# Patient Record
Sex: Female | Born: 1975 | Race: Black or African American | Hispanic: No | Marital: Married | Smoking: Never smoker
Health system: Southern US, Community
[De-identification: ages and names within clinical notes are randomized; demographics above are authoritative.]

## PROBLEM LIST (undated history)

## (undated) DIAGNOSIS — R7303 Prediabetes: Secondary | ICD-10-CM

## (undated) DIAGNOSIS — I10 Essential (primary) hypertension: Secondary | ICD-10-CM

## (undated) DIAGNOSIS — J309 Allergic rhinitis, unspecified: Secondary | ICD-10-CM

## (undated) DIAGNOSIS — J4520 Mild intermittent asthma, uncomplicated: Secondary | ICD-10-CM

## (undated) DIAGNOSIS — Z87898 Personal history of other specified conditions: Secondary | ICD-10-CM

## (undated) DIAGNOSIS — K219 Gastro-esophageal reflux disease without esophagitis: Secondary | ICD-10-CM

## (undated) DIAGNOSIS — Z8739 Personal history of other diseases of the musculoskeletal system and connective tissue: Secondary | ICD-10-CM

## (undated) DIAGNOSIS — R011 Cardiac murmur, unspecified: Secondary | ICD-10-CM

## (undated) DIAGNOSIS — J45909 Unspecified asthma, uncomplicated: Secondary | ICD-10-CM

## (undated) HISTORY — PX: TUBAL LIGATION: SHX77

## (undated) HISTORY — PX: LAPAROSCOPIC BILATERAL SALPINGECTOMY: SHX5889

## (undated) HISTORY — PX: ANKLE SURGERY: SHX546

## (undated) HISTORY — PX: ARTHRODESIS METATARSALPHALANGEAL JOINT (MTPJ): SHX6566

## (undated) HISTORY — PX: DILATION AND CURETTAGE OF UTERUS: SHX78

---

## 1999-02-02 HISTORY — PX: ORIF ANKLE FRACTURE: SUR919

## 2012-10-31 DIAGNOSIS — I10 Essential (primary) hypertension: Secondary | ICD-10-CM | POA: Insufficient documentation

## 2013-08-05 DIAGNOSIS — Z9889 Other specified postprocedural states: Secondary | ICD-10-CM | POA: Insufficient documentation

## 2014-10-24 DIAGNOSIS — J45909 Unspecified asthma, uncomplicated: Secondary | ICD-10-CM | POA: Insufficient documentation

## 2014-11-12 DIAGNOSIS — IMO0001 Reserved for inherently not codable concepts without codable children: Secondary | ICD-10-CM | POA: Insufficient documentation

## 2014-11-12 DIAGNOSIS — Z01419 Encounter for gynecological examination (general) (routine) without abnormal findings: Secondary | ICD-10-CM | POA: Insufficient documentation

## 2014-11-12 DIAGNOSIS — Z803 Family history of malignant neoplasm of breast: Secondary | ICD-10-CM | POA: Insufficient documentation

## 2016-09-09 DIAGNOSIS — R7303 Prediabetes: Secondary | ICD-10-CM | POA: Insufficient documentation

## 2016-10-18 DIAGNOSIS — M2140 Flat foot [pes planus] (acquired), unspecified foot: Secondary | ICD-10-CM | POA: Insufficient documentation

## 2017-11-14 DIAGNOSIS — Z6841 Body Mass Index (BMI) 40.0 and over, adult: Secondary | ICD-10-CM | POA: Insufficient documentation

## 2018-09-28 DIAGNOSIS — N852 Hypertrophy of uterus: Secondary | ICD-10-CM | POA: Insufficient documentation

## 2018-12-12 ENCOUNTER — Other Ambulatory Visit: Payer: Self-pay

## 2018-12-12 ENCOUNTER — Emergency Department (INDEPENDENT_AMBULATORY_CARE_PROVIDER_SITE_OTHER)
Admission: EM | Admit: 2018-12-12 | Discharge: 2018-12-12 | Disposition: A | Discharge status: Home / Self Care | Payer: Federal, State, Local not specified - PPO | Source: Home / Self Care

## 2018-12-12 ENCOUNTER — Emergency Department (INDEPENDENT_AMBULATORY_CARE_PROVIDER_SITE_OTHER): Payer: Federal, State, Local not specified - PPO

## 2018-12-12 DIAGNOSIS — W19XXXA Unspecified fall, initial encounter: Secondary | ICD-10-CM | POA: Diagnosis not present

## 2018-12-12 DIAGNOSIS — M25571 Pain in right ankle and joints of right foot: Secondary | ICD-10-CM | POA: Diagnosis not present

## 2018-12-12 DIAGNOSIS — M25512 Pain in left shoulder: Secondary | ICD-10-CM | POA: Diagnosis not present

## 2018-12-12 MED ORDER — ACETAMINOPHEN 325 MG PO TABS
975.0000 mg | ORAL_TABLET | Freq: Once | ORAL | Status: AC
  Administered 2018-12-12: 975 mg via ORAL

## 2018-12-12 NOTE — ED Triage Notes (Signed)
Pt c/o LT shoulder pain and RT foot pain after taking a fall at home. Fell with leg underneath her and arm extended backwards when she heard a pop in shoulder. Pain 10/10 No OTC pain meds taken.

## 2018-12-12 NOTE — ED Provider Notes (Signed)
Brittney Marshall    CSN: [id number] Arrival date & time: 12/12/18  [id number]      History   Chief Complaint Chief Complaint  Patient presents with  . Fall  . Shoulder Pain    LT  . Foot Pain    RT    HPI Brittney Marshall is a 43 y.o. female with history of hypertension presenting for left shoulder and right ankle pain s/p fall at home yesterday.  Patient denies head trauma, LOC.  Patient states she fell on her right ankle when it was hyper plantarflexed.  Patient has had previous ankle fracture and is status post surgery.  Patient denies falling on her left shoulder, though was catching herself heard "a pop".  Has had limited range of motion since then.  Denies sensory deficit.  Has not taken anything for symptoms.   History reviewed. No pertinent past medical history.  There are no active problems to display for this patient.   History reviewed. No pertinent surgical history.  OB History   No obstetric history on file.      Home Medications    Prior to Admission medications   Medication Sig Start Date End Date Taking? Authorizing Provider  cetirizine (ZYRTEC) 10 MG tablet Take 10 mg by mouth daily. 10/31/18   [provider]  losartan-hydrochlorothiazide (HYZAAR) 50-12.5 MG tablet Take 1 tablet by mouth daily. 11/02/18   [provider]  RYBELSUS 3 MG TABS Take 1 tablet by mouth daily. 11/23/18   [provider]    Family History No family history on file.  Social History Social History   Tobacco Use  . Smoking status: Never Smoker  . Smokeless tobacco: Never Used  Substance Use Topics  . Alcohol use: Yes    Frequency: Never    Comment: occ  . Drug use: Not on file     Allergies   Penicillins   Review of Systems Review of Systems  Constitutional: Negative for fatigue and fever.  Respiratory: Negative for cough and shortness of breath.   Cardiovascular: Negative for chest pain and palpitations.  Musculoskeletal:     Positive for left shoulder pain, right ankle pain  Neurological: Negative for weakness and numbness.     Physical Exam Triage Vital Signs ED Triage Vitals  Enc Vitals Group     BP      Pulse      Resp      Temp      Temp src      SpO2      Weight      Height      Head Circumference      Peak Flow      Pain Score      Pain Loc      Pain Edu?      Excl. in GC?    No data found.  Updated Vital Signs BP 136/78 (BP Location: Right Arm)   Pulse 86   Temp 98.3 F (36.8 C) (Oral)   Resp 18   Ht 5\' 7"  (1.702 m)   Wt 282 lb (127.9 kg)   LMP 12/02/2018 (Approximate)   SpO2 99%   BMI 44.17 kg/m   Visual Acuity Right Eye Distance:   Left Eye Distance:   Bilateral Distance:    Right Eye Near:   Left Eye Near:    Bilateral Near:     Physical Exam Constitutional:      General: She is not in acute distress. HENT:  Head: Normocephalic and atraumatic.     Mouth/Throat:     Mouth: Mucous membranes are moist.     Pharynx: Oropharynx is clear.  Eyes:     General: No scleral icterus.    Conjunctiva/sclera: Conjunctivae normal.     Pupils: Pupils are equal, round, and reactive to light.  Cardiovascular:     Rate and Rhythm: Normal rate.  Pulmonary:     Effort: Pulmonary effort is normal. No respiratory distress.  Musculoskeletal:     Left shoulder: She exhibits decreased range of motion, tenderness, bony tenderness and decreased strength. She exhibits no swelling, no effusion and no crepitus.     Right ankle: She exhibits swelling. She exhibits normal range of motion, no ecchymosis, no deformity, no laceration and normal pulse. Achilles tendon exhibits no pain and no defect.       Feet:     Comments: Left shoulder exam limited second to patient cooperation due to pain.  Diffuse joint tenderness.  Grip strength 5/5 bilaterally  Skin:    Capillary Refill: Capillary refill takes less than 2 seconds.  Neurological:     General: No focal deficit present.     Mental  Status: She is alert and oriented to person, place, and time.     Cranial Nerves: No cranial nerve deficit.     Coordination: Coordination normal.     Gait: Gait abnormal.     Deep Tendon Reflexes: Reflexes normal.     Comments: Gait mildly antalgic favoring right      UC Treatments / Results  Labs (all labs ordered are listed, but only abnormal results are displayed) Labs Reviewed - No data to display  EKG   Radiology Dg Ankle Complete Right  Result Date: 12/12/2018 CLINICAL DATA:  Right ankle pain and limited range of motion secondary to a fall last night. EXAM: RIGHT ANKLE - COMPLETE 3+ VIEW COMPARISON:  None. FINDINGS: There is no fracture or dislocation. There are moderate arthritic changes at the ankle joint and there are probable old posttraumatic changes of the distal tibiofibular articulation. Prominent osteophytes on the talus. Small osteophytes on the dorsal aspect of the navicular. No discrete joint effusion. IMPRESSION: No acute abnormality. Arthritic changes of the ankle joint. Probable old posttraumatic changes of the distal tibiofibular articulation. Electronically Signed   By: Francene Boyers M.D.   On: 12/12/2018 10:47   Dg Shoulder Left  Result Date: 12/12/2018 CLINICAL DATA:  Left shoulder pain with limited and painful range of motion since a fall last night. EXAM: LEFT SHOULDER - 2+ VIEW COMPARISON:  None. FINDINGS: There is no evidence of fracture or dislocation. There is no evidence of arthropathy or other focal bone abnormality. Soft tissues are unremarkable. IMPRESSION: Negative. Electronically Signed   By: Francene Boyers M.D.   On: 12/12/2018 10:45    Procedures Procedures (including critical Marshall time)  Medications Ordered in UC Medications  acetaminophen (TYLENOL) tablet 975 mg (975 mg Oral Given 12/12/18 1039)    Initial Impression / Assessment and Plan / UC Course  I have reviewed the triage vital signs and the nursing notes.  Pertinent labs &  imaging results that were available during my Marshall of the patient were reviewed by me and considered in my medical decision making (see chart for details).     X-ray of right ankle, left shoulder obtained in office, reviewed by me radiology: Both negative for acute pathology such as fracture, dislocation.  There is some arthritic changes of right ankle joint.  Reviewed findings with patient who verbalized understanding.  Patient apply Ace wrap, ice to right ankle, ice and do shoulder exercises provided for left shoulder.  Patient has orthopedic provider, will follow up with them next week for persistent, worsening symptoms.  Return precautions discussed, patient verbalized understanding and is agreeable to plan. Final Clinical Impressions(s) / UC Diagnoses   Final diagnoses:  Acute pain of left shoulder  Acute right ankle pain  Fall, initial encounter     Discharge Instructions     Recommend RICE: rest, ice, compression, elevation as needed for pain.    Heat therapy (hot compress, warm wash red, hot showers, etc.) can help relax muscles and soothe muscle aches. Cold therapy (ice packs) can be used to help swelling both after injury and after prolonged use of areas of chronic pain/aches.  For pain: recommend 350 mg-1000 mg of Tylenol (acetaminophen) and/or 200 mg - 800 mg of Advil (ibuprofen, Motrin) every 8 hours as needed.  May alternate between the two throughout the day as they are generally safe to take together.  DO NOT exceed more than 3000 mg of Tylenol or 3200 mg of ibuprofen in a 24 hour period as this could damage your stomach, kidneys, liver, or increase your bleeding risk.    ED Prescriptions    None     PDMP not reviewed this encounter.   Hall[place], [place], [place] 12/12/18 1148

## 2018-12-12 NOTE — Discharge Instructions (Addendum)

## 2019-03-22 ENCOUNTER — Emergency Department (INDEPENDENT_AMBULATORY_CARE_PROVIDER_SITE_OTHER)
Admission: EM | Admit: 2019-03-22 | Discharge: 2019-03-22 | Disposition: A | Discharge status: Home / Self Care | Source: Home / Self Care

## 2019-03-22 ENCOUNTER — Encounter: Payer: Self-pay | Admitting: Family Medicine

## 2019-03-22 ENCOUNTER — Other Ambulatory Visit: Payer: Self-pay

## 2019-03-22 DIAGNOSIS — R05 Cough: Secondary | ICD-10-CM | POA: Diagnosis not present

## 2019-03-22 DIAGNOSIS — J0101 Acute recurrent maxillary sinusitis: Secondary | ICD-10-CM | POA: Diagnosis not present

## 2019-03-22 DIAGNOSIS — R059 Cough, unspecified: Secondary | ICD-10-CM

## 2019-03-22 HISTORY — DX: Unspecified asthma, uncomplicated: J45.909

## 2019-03-22 MED ORDER — AMOXICILLIN 875 MG PO TABS: 875.0000 mg | ORAL_TABLET | Freq: Two times a day (BID) | ORAL | 0 refills | Status: DC

## 2019-03-22 MED ORDER — FLUTICASONE PROPIONATE 50 MCG/ACT NA SUSP: 2.0000 | Freq: Every day | NASAL | 12 refills | Status: DC

## 2019-03-22 NOTE — ED Provider Notes (Signed)
[facility]    CSN: [id number] Arrival date & time: 03/22/19  1511      History   Chief Complaint Chief Complaint  Patient presents with  . Nasal Congestion    HPI Brittney Marshall is a 44 y.o. female.   44 yo established [facility] patient with asthma complaining of cough and PND (postnasal drainage).  Has seasonal asthma as well as allergies.  For the last 3 weeks has had sinus drainage, always is clearing her throat.  Cough has become worse today.  Having some mucous.  Throat is irritated due to cough and clearing throat. No fever or shortness of breath  Works for the [employer].     Past Medical History:  Diagnosis Date  . Asthma     There are no problems to display for this patient.   Past Surgical History:  Procedure Laterality Date  . ANKLE SURGERY    . TUBAL LIGATION      OB History   No obstetric history on file.      Home Medications    Prior to Admission medications   Medication Sig Start Date End Date Taking? Authorizing Provider  amoxicillin (AMOXIL) 875 MG tablet Take 1 tablet (875 mg total) by mouth 2 (two) times daily. 03/22/19   Elvina Sidle, MD  cetirizine (ZYRTEC) 10 MG tablet Take 10 mg by mouth daily. 10/31/18   [provider]  fluticasone (FLONASE) 50 MCG/ACT nasal spray Place 2 sprays into both nostrils daily. 03/22/19   Elvina Sidle, MD  losartan-hydrochlorothiazide (HYZAAR) 50-12.5 MG tablet Take 1 tablet by mouth daily. 11/02/18   [provider]  omeprazole (PRILOSEC) 20 MG capsule Take 20 mg by mouth daily. 01/15/19   [provider]  RYBELSUS 3 MG TABS Take 1 tablet by mouth daily. 11/23/18   [provider]    Family History History reviewed. No pertinent family history.  Social History Social History   Tobacco Use  . Smoking status: Never Smoker  . Smokeless tobacco: Never Used  Substance Use Topics  . Alcohol use: Yes    Comment: occ  . Drug use: Not on file     Allergies    Penicillins   Review of Systems Review of Systems   Physical Exam Triage Vital Signs ED Triage Vitals  Enc Vitals Group     BP      Pulse      Resp      Temp      Temp src      SpO2      Weight      Height      Head Circumference      Peak Flow      Pain Score      Pain Loc      Pain Edu?      Excl. in GC?    No data found.  Updated Vital Signs BP 140/88 (BP Location: Left Arm)   Pulse 90   Temp 99 F (37.2 C) (Oral)   Resp 20   Ht 5\' 6"  (1.676 m)   Wt 120.2 kg   SpO2 99%   BMI 42.77 kg/m   Physical Exam Vitals and nursing note reviewed.  Constitutional:      Appearance: Normal appearance. She is obese.  HENT:     Head: Normocephalic.     Right Ear: Tympanic membrane normal.     Left Ear: Tympanic membrane normal.     Nose: Congestion present.  Mouth/Throat:     Mouth: Mucous membranes are moist.     Pharynx: Oropharynx is clear.  Eyes:     Conjunctiva/sclera: Conjunctivae normal.  Cardiovascular:     Rate and Rhythm: Normal rate.     Pulses: Normal pulses.     Heart sounds: Normal heart sounds.  Pulmonary:     Effort: Pulmonary effort is normal.     Breath sounds: Normal breath sounds.  Musculoskeletal:     Cervical back: Normal range of motion and neck supple.  Skin:    General: Skin is warm and dry.  Neurological:     General: No focal deficit present.     Mental Status: She is alert and oriented to person, place, and time.  Psychiatric:        Mood and Affect: Mood normal.        Behavior: Behavior normal.        Thought Content: Thought content normal.      UC Treatments / Results  Labs (all labs ordered are listed, but only abnormal results are displayed) Labs Reviewed - No data to display  EKG   Radiology No results found.  Procedures Procedures (including critical care time)  Medications Ordered in UC Medications - No data to display  Initial Impression / Assessment and Plan / UC Course  I have reviewed the  triage vital signs and the nursing notes.  Pertinent labs & imaging results that were available during my care of the patient were reviewed by me and considered in my medical decision making (see chart for details).     Final Clinical Impressions(s) / UC Diagnoses   Final diagnoses:  Acute recurrent maxillary sinusitis  Cough   Discharge Instructions   None    ED Prescriptions    Medication Sig Dispense Auth. Provider   amoxicillin (AMOXIL) 875 MG tablet Take 1 tablet (875 mg total) by mouth 2 (two) times daily. 20 tablet Robyn Haber, MD   fluticasone [facility]) 50 MCG/ACT nasal spray Place 2 sprays into both nostrils daily. 16 g Robyn Haber, MD     I have reviewed the PDMP during this encounter.   Robyn Haber, MD 03/22/19 [phone]

## 2019-03-22 NOTE — ED Triage Notes (Signed)
Has seasonal asthma as well as allergies.  For the last 3 weeks has had sinus drainage, always is clearing her throat.  Cough has become worse today.  Having some mucous.  Throat is irritated due to cough and clearing throat.

## 2019-04-13 ENCOUNTER — Encounter: Payer: Self-pay | Admitting: Podiatry

## 2019-04-13 ENCOUNTER — Ambulatory Visit (INDEPENDENT_AMBULATORY_CARE_PROVIDER_SITE_OTHER)

## 2019-04-13 ENCOUNTER — Other Ambulatory Visit: Payer: Self-pay

## 2019-04-13 ENCOUNTER — Ambulatory Visit (INDEPENDENT_AMBULATORY_CARE_PROVIDER_SITE_OTHER): Admitting: Podiatry

## 2019-04-13 VITALS — BP 135/85 | HR 85 | Temp 98.0°F | Resp 16

## 2019-04-13 DIAGNOSIS — R011 Cardiac murmur, unspecified: Secondary | ICD-10-CM | POA: Insufficient documentation

## 2019-04-13 DIAGNOSIS — R609 Edema, unspecified: Secondary | ICD-10-CM | POA: Diagnosis not present

## 2019-04-13 DIAGNOSIS — M7752 Other enthesopathy of left foot: Secondary | ICD-10-CM

## 2019-04-13 DIAGNOSIS — M25572 Pain in left ankle and joints of left foot: Secondary | ICD-10-CM

## 2019-04-13 DIAGNOSIS — M1990 Unspecified osteoarthritis, unspecified site: Secondary | ICD-10-CM | POA: Insufficient documentation

## 2019-04-13 MED ORDER — METHYLPREDNISOLONE 4 MG PO TBPK: ORAL_TABLET | ORAL | 0 refills | Status: DC

## 2019-04-17 ENCOUNTER — Other Ambulatory Visit: Payer: Self-pay | Admitting: Podiatry

## 2019-04-17 ENCOUNTER — Telehealth: Payer: Self-pay | Admitting: Podiatry

## 2019-04-17 DIAGNOSIS — S96912A Strain of unspecified muscle and tendon at ankle and foot level, left foot, initial encounter: Secondary | ICD-10-CM

## 2019-04-17 DIAGNOSIS — M7752 Other enthesopathy of left foot: Secondary | ICD-10-CM

## 2019-04-17 MED ORDER — TRAMADOL HCL 50 MG PO TABS: 50.0000 mg | ORAL_TABLET | Freq: Three times a day (TID) | ORAL | 0 refills | Status: AC | PRN

## 2019-04-17 NOTE — Telephone Encounter (Signed)
I called the patient back. I ordered tramadol. She is still on the steroids and having quite a bit of pain. Informed her to stay in the CAM boot. I have also ordered an MRI of the left ankle to be done at [facility]. I did make a note that she has hardware in the right foot from recent surgery.   Can someone please follow up on the MRI? Thanks.

## 2019-04-17 NOTE — Telephone Encounter (Signed)
Pt would like for you to call something in for pain.  She uses the [facility] in [place].

## 2019-04-17 NOTE — Telephone Encounter (Signed)
I spoke to pt and she complains of throbbing and aching in the achilles, is alternating tylenol and ibuprofen without relief and wearing the cam boot.

## 2019-04-17 NOTE — Progress Notes (Signed)
Subjective:   Patient ID: Brittney Marshall, female   DOB: 44 y.o.   MRN: [id number]   HPI 44 year old female presents the office today for concerns of left medial ankle pain, arch pain.  She states that she was at her dad's funeral she was wearing different shoes and while wearing the shoes and after she noticed increased pain, swelling to her ankle.  She denies any falls, twisting of her foot or ankle or any trauma.  She states that she recently surgery on her right foot she has been wearing sneakers for majority of the time however for the funeral she wore dress shoes with more of a heel.   Review of Systems  All other systems reviewed and are negative.  Past Medical History:  Diagnosis Date  . Asthma     Past Surgical History:  Procedure Laterality Date  . ANKLE SURGERY    . TUBAL LIGATION       Current Outpatient Medications:  .  budesonide (PULMICORT) 0.25 MG/2ML nebulizer solution, Take 0.25 mg by nebulization 2 (two) times daily., Disp: , Rfl:  .  fluticasone (FLONASE) 50 MCG/ACT nasal spray, Place 2 sprays into both nostrils daily., Disp: 16 g, Rfl: 12 .  levocetirizine (XYZAL) 5 MG tablet, Take 5 mg by mouth every evening., Disp: , Rfl:  .  losartan-hydrochlorothiazide (HYZAAR) 50-12.5 MG tablet, Take 1 tablet by mouth daily., Disp: , Rfl:  .  omeprazole (PRILOSEC) 20 MG capsule, Take 20 mg by mouth daily., Disp: , Rfl:  .  methylPREDNISolone (MEDROL DOSEPAK) 4 MG TBPK tablet, Take as directed, Disp: 21 tablet, Rfl: 0  Allergies  Allergen Reactions  . Penicillins         Objective:  Physical Exam  General: AAO x3, NAD  Dermatological: Skin is warm, dry and supple bilateral. Nails x 10 are well manicured; remaining integument appears unremarkable at this time. There are no open sores, no preulcerative lesions, no rash or signs of infection present.  Vascular: Dorsalis Pedis artery and Posterior Tibial artery pedal pulses are 2/4 bilateral with immedate capillary  fill time. Pedal hair growth present. No varicosities and no lower extremity edema present bilateral. There is no pain with calf compression, swelling, warmth, erythema.   Neruologic: Grossly intact via light touch bilateral. Vibratory intact via tuning fork bilateral. Protective threshold with Semmes Wienstein monofilament intact to all pedal sites bilateral. Patellar and Achilles deep tendon reflexes 2+ bilateral. No Babinski or clonus noted bilateral.   Musculoskeletal: There is tenderness palpation and swelling present on the medial aspect ankle for the majority of tenderness of the course of the posterior tibial, flexor tendons.  Overall tendons appear to be intact but she is guarding during range of motion.  Mild discomfort along the plantar fascia.  No pain with lateral compression of calcaneus.  There is no area pinpoint tenderness.   Gait: Unassisted, Nonantalgic.       Assessment:   Flexor tendinitis left ankle, swelling     Plan:  -Treatment options discussed including all alternatives, risks, and complications -Etiology of symptoms were discussed -X-rays ordered and independently reviewed then.  No evidence of acute fracture or stress fracture again seen.  Await radiology review. -Recommend elevation in cam boot was dispensed.  Medrol Dosepak.  Ice elevation.  Limit activity. -Note was provided to stay out of work until I see her back.  Return in 2 weeks (on 04/27/2019), or if symptoms worsen or fail to improve.  [name]

## 2019-04-18 ENCOUNTER — Telehealth: Payer: Self-pay | Admitting: *Deleted

## 2019-04-18 NOTE — Telephone Encounter (Signed)
Called and spoke with Myriam Jacobson at [facility] and truck is down but will be back running on 04-28-2019 and Myriam Jacobson will get the patient scheduled and will call patient as well and patient has [employer] [place] and the insurance does not require authorization. Misty Stanley

## 2019-04-19 DIAGNOSIS — J301 Allergic rhinitis due to pollen: Secondary | ICD-10-CM | POA: Insufficient documentation

## 2019-04-19 DIAGNOSIS — K219 Gastro-esophageal reflux disease without esophagitis: Secondary | ICD-10-CM | POA: Insufficient documentation

## 2019-04-19 DIAGNOSIS — J3081 Allergic rhinitis due to animal (cat) (dog) hair and dander: Secondary | ICD-10-CM | POA: Insufficient documentation

## 2019-04-19 DIAGNOSIS — H1045 Other chronic allergic conjunctivitis: Secondary | ICD-10-CM | POA: Insufficient documentation

## 2019-04-27 ENCOUNTER — Encounter: Payer: Self-pay | Admitting: Podiatry

## 2019-04-27 ENCOUNTER — Ambulatory Visit (INDEPENDENT_AMBULATORY_CARE_PROVIDER_SITE_OTHER): Admitting: Podiatry

## 2019-04-27 ENCOUNTER — Other Ambulatory Visit: Payer: Self-pay

## 2019-04-27 VITALS — Temp 98.1°F

## 2019-04-27 DIAGNOSIS — S96912D Strain of unspecified muscle and tendon at ankle and foot level, left foot, subsequent encounter: Secondary | ICD-10-CM | POA: Diagnosis not present

## 2019-04-27 DIAGNOSIS — M7752 Other enthesopathy of left foot: Secondary | ICD-10-CM

## 2019-04-27 MED ORDER — TRAMADOL HCL 50 MG PO TABS: 50.0000 mg | ORAL_TABLET | Freq: Three times a day (TID) | ORAL | 0 refills | Status: AC | PRN

## 2019-04-27 MED ORDER — MELOXICAM 15 MG PO TABS: 15.0000 mg | ORAL_TABLET | Freq: Every day | ORAL | 0 refills | Status: DC

## 2019-04-29 ENCOUNTER — Other Ambulatory Visit

## 2019-04-29 NOTE — Progress Notes (Signed)
Subjective: 44 year old female presents the office today for follow-up evaluation of left ankle pain.  She states that she is still in the boot.  She is having discomfort.  She has tried to go without the boot having difficulty putting weight on her foot.  She finished the course of steroids.  No recent falls or injuries since I last saw her.  She had no recent injury at the time of increased pain.  This started after wearing different shoes to her cat's funeral.  She previously underwent right foot surgery by another physician and she is been wearing sneakers.  Denies any systemic complaints such as fevers, chills, nausea, vomiting. No acute changes since last appointment, and no other complaints at this time.   Objective: AAO x3, NAD DP/PT pulses palpable bilaterally, CRT less than 3 seconds Tenderness along the course of the medial aspect of the ankle in the course of the flexor, posterior tibial tendon.  Some tenderness also present along the course of the Achilles tendon.  There is no deficit noted in the Achilles tendon.  Flexor, extensor tendons appear to be intact but her strength is decreased on the left side due to pain and guarding likely. No open lesions or pre-ulcerative lesions.  No pain with calf compression, swelling, warmth, erythema  Assessment: Left ankle injury, rule out tendon tear  Plan: -All treatment options discussed with the patient including all alternatives, risks, complications.  -Monitor remain in the cam boot.  Prescribe meloxicam discussed side effects.  Awaiting MRI.  Continue ice elevate.  No was provided to stay at work. -Patient encouraged to call the office with any questions, concerns, change in symptoms.   [name]

## 2019-05-07 ENCOUNTER — Other Ambulatory Visit

## 2019-05-18 ENCOUNTER — Telehealth: Payer: Self-pay | Admitting: Podiatry

## 2019-05-18 ENCOUNTER — Encounter: Payer: Self-pay | Admitting: Podiatry

## 2019-05-18 NOTE — Telephone Encounter (Signed)
Emailed note to [email or web address].

## 2019-05-18 NOTE — Telephone Encounter (Signed)
I spoke with pt and she states she is having the MRI tomorrow but is really in a lot of pain and would like to be out of work 05/19/2019 for the entire day and to schedule to get in to see Dr. Dorothyann Peng

## 2019-05-18 NOTE — Telephone Encounter (Signed)
Pt called to see if she can get a note for 05/19/19 for work and she has a MRI set for 05/19/19 and shes in pain but she will need a Dr.Note

## 2019-05-19 ENCOUNTER — Ambulatory Visit (INDEPENDENT_AMBULATORY_CARE_PROVIDER_SITE_OTHER)

## 2019-05-19 ENCOUNTER — Other Ambulatory Visit: Payer: Self-pay

## 2019-05-19 DIAGNOSIS — S96912A Strain of unspecified muscle and tendon at ankle and foot level, left foot, initial encounter: Secondary | ICD-10-CM | POA: Diagnosis not present

## 2019-05-25 ENCOUNTER — Ambulatory Visit (INDEPENDENT_AMBULATORY_CARE_PROVIDER_SITE_OTHER): Admitting: Podiatry

## 2019-05-25 ENCOUNTER — Other Ambulatory Visit: Payer: Self-pay

## 2019-05-25 ENCOUNTER — Encounter: Payer: Self-pay | Admitting: Podiatry

## 2019-05-25 VITALS — Temp 98.1°F

## 2019-05-25 DIAGNOSIS — M7752 Other enthesopathy of left foot: Secondary | ICD-10-CM | POA: Diagnosis not present

## 2019-05-25 DIAGNOSIS — M629 Disorder of muscle, unspecified: Secondary | ICD-10-CM | POA: Diagnosis not present

## 2019-05-25 NOTE — Patient Instructions (Signed)
For instructions on how to put on your Tri-Lock Ankle Brace, please visit [email or web address]    Plantar Fasciitis (Heel Spur Syndrome) with Rehab The plantar fascia is a fibrous, ligament-like, soft-tissue structure that spans the bottom of the foot. Plantar fasciitis is a condition that causes pain in the foot due to inflammation of the tissue. SYMPTOMS  Pain and tenderness on the underneath side of the foot. Pain that worsens with standing or walking. CAUSES  Plantar fasciitis is caused by irritation and injury to the plantar fascia on the underneath side of the foot. Common mechanisms of injury include: Direct trauma to bottom of the foot. Damage to a small nerve that runs under the foot where the main fascia attaches to the heel bone. Stress placed on the plantar fascia due to bone spurs. RISK INCREASES WITH:  Activities that place stress on the plantar fascia (running, jumping, pivoting, or cutting). Poor strength and flexibility. Improperly fitted shoes. Tight calf muscles. Flat feet. Failure to warm-up properly before activity. Obesity. PREVENTION Warm up and stretch properly before activity. Allow for adequate recovery between workouts. Maintain physical fitness: Strength, flexibility, and endurance. Cardiovascular fitness. Maintain a health body weight. Avoid stress on the plantar fascia. Wear properly fitted shoes, including arch supports for individuals who have flat feet.  PROGNOSIS  If treated properly, then the symptoms of plantar fasciitis usually resolve without surgery. However, occasionally surgery is necessary.  RELATED COMPLICATIONS  Recurrent symptoms that may result in a chronic condition. Problems of the lower back that are caused by compensating for the injury, such as limping. Pain or weakness of the foot during push-off following surgery. Chronic inflammation, scarring, and partial or complete fascia tear, occurring more often from repeated  injections.  TREATMENT  Treatment initially involves the use of ice and medication to help reduce pain and inflammation. The use of strengthening and stretching exercises may help reduce pain with activity, especially stretches of the Achilles tendon. These exercises may be performed at home or with a therapist. Your caregiver may recommend that you use heel cups of arch supports to help reduce stress on the plantar fascia. Occasionally, corticosteroid injections are given to reduce inflammation. If symptoms persist for greater than 6 months despite non-surgical (conservative), then surgery may be recommended.   MEDICATION  If pain medication is necessary, then nonsteroidal anti-inflammatory medications, such as aspirin and ibuprofen, or other minor pain relievers, such as acetaminophen, are often recommended. Do not take pain medication within 7 days before surgery. Prescription pain relievers may be given if deemed necessary by your caregiver. Use only as directed and only as much as you need. Corticosteroid injections may be given by your caregiver. These injections should be reserved for the most serious cases, because they may only be given a certain number of times.  HEAT AND COLD Cold treatment (icing) relieves pain and reduces inflammation. Cold treatment should be applied for 10 to 15 minutes every 2 to 3 hours for inflammation and pain and immediately after any activity that aggravates your symptoms. Use ice packs or massage the area with a piece of ice (ice massage). Heat treatment may be used prior to performing the stretching and strengthening activities prescribed by your caregiver, physical therapist, or athletic trainer. Use a heat pack or soak the injury in warm water.  SEEK IMMEDIATE MEDICAL CARE IF: Treatment seems to offer no benefit, or the condition worsens. Any medications produce adverse side effects.  EXERCISES- RANGE OF MOTION (ROM) AND STRETCHING EXERCISES - Plantar    Fasciitis (Heel Spur Syndrome) These exercises may help you when beginning to rehabilitate your injury. Your symptoms may resolve with or without further involvement from your physician, physical therapist or athletic trainer. While completing these exercises, remember:  Restoring tissue flexibility helps normal motion to return to the joints. This allows healthier, less painful movement and activity. An effective stretch should be held for at least 30 seconds. A stretch should never be painful. You should only feel a gentle lengthening or release in the stretched tissue.  RANGE OF MOTION - Toe Extension, Flexion Sit with your right / left leg crossed over your opposite knee. Grasp your toes and gently pull them back toward the top of your foot. You should feel a stretch on the bottom of your toes and/or foot. Hold this stretch for 10 seconds. Now, gently pull your toes toward the bottom of your foot. You should feel a stretch on the top of your toes and or foot. Hold this stretch for 10 seconds. Repeat  times. Complete this stretch 3 times per day.   RANGE OF MOTION - Ankle Dorsiflexion, Active Assisted Remove shoes and sit on a chair that is preferably not on a carpeted surface. Place right / left foot under knee. Extend your opposite leg for support. Keeping your heel down, slide your right / left foot back toward the chair until you feel a stretch at your ankle or calf. If you do not feel a stretch, slide your bottom forward to the edge of the chair, while still keeping your heel down. Hold this stretch for 10 seconds. Repeat 3 times. Complete this stretch 2 times per day.   STRETCH  Gastroc, Standing Place hands on wall. Extend right / left leg, keeping the front knee somewhat bent. Slightly point your toes inward on your back foot. Keeping your right / left heel on the floor and your knee straight, shift your weight toward the wall, not allowing your back to arch. You should feel a  gentle stretch in the right / left calf. Hold this position for 10 seconds. Repeat 3 times. Complete this stretch 2 times per day.  STRETCH  Soleus, Standing Place hands on wall. Extend right / left leg, keeping the other knee somewhat bent. Slightly point your toes inward on your back foot. Keep your right / left heel on the floor, bend your back knee, and slightly shift your weight over the back leg so that you feel a gentle stretch deep in your back calf. Hold this position for 10 seconds. Repeat 3 times. Complete this stretch 2 times per day.  STRETCH  Gastrocsoleus, Standing  Note: This exercise can place a lot of stress on your foot and ankle. Please complete this exercise only if specifically instructed by your caregiver.  Place the ball of your right / left foot on a step, keeping your other foot firmly on the same step. Hold on to the wall or a rail for balance. Slowly lift your other foot, allowing your body weight to press your heel down over the edge of the step. You should feel a stretch in your right / left calf. Hold this position for 10 seconds. Repeat this exercise with a slight bend in your right / left knee. Repeat 3 times. Complete this stretch 2 times per day.   STRENGTHENING EXERCISES - Plantar Fasciitis (Heel Spur Syndrome)  These exercises may help you when beginning to rehabilitate your injury. They may resolve your symptoms with or without further involvement   from your physician, physical therapist or athletic trainer. While completing these exercises, remember:  Muscles can gain both the endurance and the strength needed for everyday activities through controlled exercises. Complete these exercises as instructed by your physician, physical therapist or athletic trainer. Progress the resistance and repetitions only as guided.  STRENGTH - Towel Curls Sit in a chair positioned on a non-carpeted surface. Place your foot on a towel, keeping your heel on the  floor. Pull the towel toward your heel by only curling your toes. Keep your heel on the floor. Repeat 3 times. Complete this exercise 2 times per day.  STRENGTH - Ankle Inversion Secure one end of a rubber exercise band/tubing to a fixed object (table, pole). Loop the other end around your foot just before your toes. Place your fists between your knees. This will focus your strengthening at your ankle. Slowly, pull your big toe up and in, making sure the band/tubing is positioned to resist the entire motion. Hold this position for 10 seconds. Have your muscles resist the band/tubing as it slowly pulls your foot back to the starting position. Repeat 3 times. Complete this exercises 2 times per day.  Document Released: 01/18/2005 Document Revised: 04/12/2011 Document Reviewed: 05/02/2008 [employer] Patient Information 2014 [employer].  

## 2019-05-25 NOTE — Progress Notes (Signed)
Subjective: 44 year old female presents the office today for follow-up evaluation of left ankle and shin to discuss MRI results.  She says overall she is doing better she is back to wearing a shoe.  She has returned to work but she has had a leak was discussed the pain.  Swelling is also improved.  She denies any recent injury or falls and she has no other concerns. Denies any systemic complaints such as fevers, chills, nausea, vomiting. No acute changes since last appointment, and no other complaints at this time.   Objective: AAO x3, NAD DP/PT pulses palpable bilaterally, CRT less than 3 seconds There is mild discomfort to palpation on the medial aspect of the ankle in the course of the flexor tendons and minimal edema.  She also seen some discomfort on the course of the Achilles tendon today but Janee Morn test is negative and the Achilles tendon appears to be intact.  No deficit noted.  Mild discomfort the plantar aspect of the heel on the course of plantar fascia.  No areas of pinpoint tenderness or pain with lateral compression of the calcaneus. No open lesions or pre-ulcerative lesions.  No pain with calf compression, swelling, warmth, erythema  Assessment: 44 year old female with tendinitis, plantar fascial tear  Plan: -All treatment options discussed with the patient including all alternatives, risks, complications.  -She is back to wearing a regular shoe.  At this point I want her to start some stretching, rehab exercises as we discussed.  Continue to ice the area as well.  Meloxicam as needed.  Remain to continue FMLA for intermittent.  She practically has to flares a week.  Exercise for about 6 more weeks.  She can work about 8 hours a day. -Patient encouraged to call the office with any questions, concerns, change in symptoms.   Return in about 6 weeks (around 07/06/2019).  [name]

## 2019-07-06 ENCOUNTER — Ambulatory Visit: Admitting: Podiatry

## 2019-08-01 DIAGNOSIS — R0683 Snoring: Secondary | ICD-10-CM | POA: Insufficient documentation

## 2019-08-01 DIAGNOSIS — R053 Chronic cough: Secondary | ICD-10-CM | POA: Insufficient documentation

## 2019-08-03 ENCOUNTER — Other Ambulatory Visit: Payer: Self-pay

## 2019-08-03 ENCOUNTER — Encounter: Payer: Self-pay | Admitting: Podiatry

## 2019-08-03 ENCOUNTER — Telehealth: Payer: Self-pay | Admitting: *Deleted

## 2019-08-03 ENCOUNTER — Ambulatory Visit (INDEPENDENT_AMBULATORY_CARE_PROVIDER_SITE_OTHER): Admitting: Podiatry

## 2019-08-03 VITALS — Temp 98.0°F

## 2019-08-03 DIAGNOSIS — M629 Disorder of muscle, unspecified: Secondary | ICD-10-CM

## 2019-08-03 DIAGNOSIS — M7752 Other enthesopathy of left foot: Secondary | ICD-10-CM | POA: Diagnosis not present

## 2019-08-03 DIAGNOSIS — B351 Tinea unguium: Secondary | ICD-10-CM

## 2019-08-03 DIAGNOSIS — M25572 Pain in left ankle and joints of left foot: Secondary | ICD-10-CM | POA: Diagnosis not present

## 2019-08-03 DIAGNOSIS — M21619 Bunion of unspecified foot: Secondary | ICD-10-CM

## 2019-08-03 DIAGNOSIS — G8929 Other chronic pain: Secondary | ICD-10-CM

## 2019-08-03 NOTE — Telephone Encounter (Signed)
-----   Message from Vivi Barrack, DPM sent at 08/03/2019  8:47 AM EDT ----- Can you please order PT for [facility]

## 2019-08-03 NOTE — Progress Notes (Signed)
Subjective: 44 year old female presents the office today for follow evaluation of left ankle and foot pain.  She states that overall she is doing much better but she still gets some discomfort about once a week she gets pain where she does not take meloxicam and sometimes will not go to work because of discomfort.  Otherwise she is able to walk and has no issues or swelling.  She also has secondary concerns of bunion left foot that she wants to consider surgery for in August or September.  The bunion is tender with pressure in shoes and along the bump.  Also she has concerns of her right big toenail becoming thickened discolored and is loose.  No drainage or pus from the nail.  She has pedicures frequently. Denies any systemic complaints such as fevers, chills, nausea, vomiting. No acute changes since last appointment, and no other complaints at this time.   Objective: AAO x3, NAD DP/PT pulses palpable bilaterally, CRT less than 3 seconds On the left side there is no significant tenderness palpation the course of the posterior tibial, flexor tendons and there is no significant discomfort on the plantar fascial but she still gets discomfort mostly on the medial ankle although no tenderness today.  There is no significant edema, erythema.  Moderate bunion deformities present with tenderness on the medial first metatarsal head on the bunion site.  No pain or crepitation MPJ range of motion.  Bilateral hallux nails are hypertrophic, dystrophic with brown, dark discoloration.  No extension of any hyperpigmentation concerning skin.  There are loose from the underlying nail bed only attached to the proximal aspect there is no edema, erythema or signs of infection. No pain with calf compression, swelling, warmth, erythema  Assessment: Residual left posterior tibial tendinitis, plantar fasciitis; left foot bunion; onychodystrophy  Plan: -All treatment options discussed with the patient including all  alternatives, risks, complications.  -Regards the left ankle, foot pain she is currently not expensing any discomfort but she still does get intermittent pain.  Will refer to physical therapy at this time discussed shoe modifications orthotics.  Referral to physical therapy at [facility].  -Regards left foot bunion we discussed surgery.  I will have her get weightbearing x-rays of the left foot next appointment prior to coming in. -Debrided the hallux toenails that and complications and sent this to culture, pathology to [facility] labs -Patient encouraged to call the office with any questions, concerns, change in symptoms.   RTC 6 weeks  [name]

## 2019-08-03 NOTE — Telephone Encounter (Signed)
Received [facility] fax [phone]. Faxed orders for PT to [facility].

## 2019-08-03 NOTE — Patient Instructions (Signed)
Bunion  A bunion is a bump on the base of the big toe that forms when the bones of the big toe joint move out of position. Bunions may be small at first, but they often get larger over time. They can make walking painful. What are the causes? A bunion may be caused by:  Wearing narrow or pointed shoes that force the big toe to press against the other toes.  Abnormal foot development that causes the foot to roll inward (pronate).  Changes in the foot that are caused by certain diseases, such as rheumatoid arthritis or polio.  A foot injury. What increases the risk? The following factors may make you more likely to develop this condition:  Wearing shoes that squeeze the toes together.  Having certain diseases, such as: ? Rheumatoid arthritis. ? Polio. ? Cerebral palsy.  Having family members who have bunions.  Being born with a foot deformity, such as flat feet or low arches.  Doing activities that put a lot of pressure on the feet, such as ballet dancing. What are the signs or symptoms? The main symptom of a bunion is a noticeable bump on the big toe. Other symptoms may include:  Pain.  Swelling around the big toe.  Redness and inflammation.  Thick or hardened skin on the big toe or between the toes.  Stiffness or loss of motion in the big toe.  Trouble with walking. How is this diagnosed? A bunion may be diagnosed based on your symptoms, medical history, and activities. You may have tests, such as:  X-rays. These allow your health care provider to check the position of the bones in your foot and look for damage to your joint. They also help your health care provider determine the severity of your bunion and the best way to treat it.  Joint aspiration. In this test, a sample of fluid is removed from the toe joint. This test may be done if you are in a lot of pain. It helps rule out diseases that cause painful swelling of the joints, such as arthritis. How is this  treated? Treatment depends on the severity of your symptoms. The goal of treatment is to relieve symptoms and prevent the bunion from getting worse. Your health care provider may recommend:  Wearing shoes that have a wide toe box.  Using bunion pads to cushion the affected area.  Taping your toes together to keep them in a normal position.  Placing a device inside your shoe (orthotics) to help reduce pressure on your toe joint.  Taking medicine to ease pain, inflammation, and swelling.  Applying heat or ice to the affected area.  Doing stretching exercises.  Surgery to remove scar tissue and move the toes back into their normal position. This treatment is rare. Follow these instructions at home: Managing pain, stiffness, and swelling   If directed, put ice on the painful area: ? Put ice in a plastic bag. ? Place a towel between your skin and the bag. ? Leave the ice on for 20 minutes, 2-3 times a day. Activity   If directed, apply heat to the affected area before you exercise. Use the heat source that your health care provider recommends, such as a moist heat pack or a heating pad. ? Place a towel between your skin and the heat source. ? Leave the heat on for 20-30 minutes. ? Remove the heat if your skin turns bright red. This is especially important if you are unable to feel pain,   heat, or cold. You may have a greater risk of getting burned.  Do exercises as told by your health care provider. General instructions  Support your toe joint with proper footwear, shoe padding, or taping as told by your health care provider.  Take over-the-counter and prescription medicines only as told by your health care provider.  Keep all follow-up visits as told by your health care provider. This is important. Contact a health care provider if your symptoms:  Get worse.  Do not improve in 2 weeks. Get help right away if you have:  Severe pain and trouble with walking. Summary  A  bunion is a bump on the base of the big toe that forms when the bones of the big toe joint move out of position.  Bunions can make walking painful.  Treatment depends on the severity of your symptoms.  Support your toe joint with proper footwear, shoe padding, or taping as told by your health care provider. This information is not intended to replace advice given to you by your health care provider. Make sure you discuss any questions you have with your health care provider. Document Revised: 07/25/2017 Document Reviewed: 05/31/2017 Elsevier Patient Education  2020 [employer]  

## 2019-08-03 NOTE — Telephone Encounter (Signed)
Left message requesting referral information and fax for [facility].

## 2019-08-08 ENCOUNTER — Ambulatory Visit: Admitting: Rehabilitative and Restorative Service Providers"

## 2019-08-13 ENCOUNTER — Encounter: Admitting: Physical Therapy

## 2019-08-14 ENCOUNTER — Telehealth: Payer: Self-pay | Admitting: *Deleted

## 2019-08-14 NOTE — Telephone Encounter (Signed)
Left message for pt to call for results  

## 2019-08-14 NOTE — Telephone Encounter (Signed)
I informed pt of Dr. Gabriel Rung review of results and recommendations and pt states she would like to purchase Revitaderm40. I told pt I would have Dr. Dorthula Matas assistant pick take to the [facility] office for pick up on Friday. I gave Revitaderm40 to K. Mavrakis, CMA to take to [facility] for pt pick up.

## 2019-08-14 NOTE — Telephone Encounter (Signed)
-----   Message from Vivi Barrack, DPM sent at 08/13/2019  5:15 PM EDT ----- Val- please let her know that the culture did not show fungus- recommend using urea gel to the nails

## 2019-08-16 ENCOUNTER — Encounter: Admitting: Rehabilitative and Restorative Service Providers"

## 2019-08-20 ENCOUNTER — Encounter: Admitting: Physical Therapy

## 2019-08-23 ENCOUNTER — Encounter: Admitting: Rehabilitative and Restorative Service Providers"

## 2019-08-24 DIAGNOSIS — J385 Laryngeal spasm: Secondary | ICD-10-CM | POA: Insufficient documentation

## 2019-08-27 ENCOUNTER — Encounter: Admitting: Physical Therapy

## 2019-08-30 ENCOUNTER — Encounter: Admitting: Physical Therapy

## 2019-09-14 ENCOUNTER — Ambulatory Visit: Admitting: Podiatry

## 2019-09-20 ENCOUNTER — Telehealth: Payer: Self-pay | Admitting: Podiatry

## 2019-09-20 DIAGNOSIS — R1110 Vomiting, unspecified: Secondary | ICD-10-CM | POA: Insufficient documentation

## 2019-09-20 DIAGNOSIS — K224 Dyskinesia of esophagus: Secondary | ICD-10-CM | POA: Insufficient documentation

## 2019-09-20 NOTE — Telephone Encounter (Signed)
Patient has a appointment with you tomorrow, but would like intermittent FMLA, her continuous FMLA is scheduled to end on 10/30/2019 and would like intermittent after that.

## 2019-09-20 NOTE — Telephone Encounter (Signed)
Ok I will see her tomorrow and talk to her about it

## 2019-09-21 ENCOUNTER — Other Ambulatory Visit: Payer: Self-pay

## 2019-09-21 ENCOUNTER — Ambulatory Visit (INDEPENDENT_AMBULATORY_CARE_PROVIDER_SITE_OTHER)

## 2019-09-21 ENCOUNTER — Encounter: Payer: Self-pay | Admitting: Podiatry

## 2019-09-21 ENCOUNTER — Ambulatory Visit (INDEPENDENT_AMBULATORY_CARE_PROVIDER_SITE_OTHER): Admitting: Podiatry

## 2019-09-21 VITALS — Temp 97.2°F

## 2019-09-21 DIAGNOSIS — L603 Nail dystrophy: Secondary | ICD-10-CM

## 2019-09-21 DIAGNOSIS — M21619 Bunion of unspecified foot: Secondary | ICD-10-CM

## 2019-09-21 DIAGNOSIS — M7752 Other enthesopathy of left foot: Secondary | ICD-10-CM | POA: Diagnosis not present

## 2019-09-21 NOTE — Patient Instructions (Signed)

## 2019-09-23 NOTE — Progress Notes (Signed)
Subjective: 44 year old female presents the office today for follow evaluation of left ankle and foot pain.  She states that overall the ankle is doing better.  There are some states that she just has discomfort and has difficulty working other day she does great.  She has not yet started physical therapy.  Also also discussed bunion surgery in her left foot.  She presented with right first toe fusion arthrodesis on the right side has done well from that and she wants to consider having surgery in the left side.  She is try to medications and offloading and continues to have pain on the bunion.  She states that it started to hurt more and she wants to proceed with surgery.  Also concerned in the nails.  She started the urea cream but she states that the nails most of the right big toenail looks like he wants to come off.  Denies any drainage or pus. Denies any systemic complaints such as fevers, chills, nausea, vomiting. No acute changes since last appointment, and no other complaints at this time.   Objective: AAO x3, NAD DP/PT pulses palpable bilaterally, CRT less than 3 seconds There is no significant discomfort on the course the posterior tibial, flexor tendons on the left medial ankle.  There is no edema, erythema.  Flexor, extensor tendons appear to be intact.  Flatfoot is present.  Moderate to severe bunion deformities present and there is tenderness on the medial first metatarsal head.  There is no pain with first MPJ range of motion there is hypermobility present of the first ray.  Nails appear to be hypertrophic, dystrophic and overall unchanged.  She has nail polish on that I'm not able to remove.  There is no hyperpigmentation of the surrounding skin or any edema, erythema. No pain with calf compression, swelling, warmth, erythema  Assessment: Residual left posterior tibial tendinitis, plantar fasciitis; left foot bunion; onychodystrophy  Plan: -All treatment options discussed with the  patient including all alternatives, risks, complications.  -In regards to the left ankle I would've her start physical therapy discussed continued supportive shoes.  Discussed home exercises that she can start as well.  Will extend intermittent FMLA. -In regards to the surgery for the bunion we discussed first MPJ arthrodesis versus Lapidus bunionectomy.  After discussion she wants to wait till January.  We'll plan on discussing more next appointment.  X-rays were obtained and reviewed which revealed moderate to severe bunion deformity.  There is no evidence of acute fracture.  Awaiting radiology report -Continue the urea cream for the nails.  Discussed to try to have the nail polish off next appointment second further evaluate.  [name]

## 2019-11-23 ENCOUNTER — Other Ambulatory Visit: Payer: Self-pay

## 2019-11-23 ENCOUNTER — Ambulatory Visit (INDEPENDENT_AMBULATORY_CARE_PROVIDER_SITE_OTHER): Admitting: Podiatry

## 2019-11-23 DIAGNOSIS — M7752 Other enthesopathy of left foot: Secondary | ICD-10-CM | POA: Diagnosis not present

## 2019-11-23 DIAGNOSIS — M21619 Bunion of unspecified foot: Secondary | ICD-10-CM | POA: Diagnosis not present

## 2019-11-23 DIAGNOSIS — L600 Ingrowing nail: Secondary | ICD-10-CM

## 2019-11-23 DIAGNOSIS — L603 Nail dystrophy: Secondary | ICD-10-CM | POA: Diagnosis not present

## 2019-11-23 NOTE — Patient Instructions (Signed)

## 2019-11-24 NOTE — Progress Notes (Signed)
Subjective: 44 year old female presents the office today for follow-up evaluation.  She states that the right big toenail did have a piece of nail come off of that that is causing discomfort is thick and discolored.  She also states on the medial aspect left hallux toenail she had some mild discomfort but denies any edema, erythema, drainage or pus.  She still doing home exercises her ankle is feeling better.  She has some good days and some bad days.  She is also consider left foot surgery in the future.  Her main concern today is the toenails.  Objective: AAO x3, NAD DP/PT pulses palpable bilaterally, CRT less than 3 seconds Right hallux toenail is slightly dystrophic, discolored with yellow-brown as well as dark discoloration.  There is no extension of any hyperpigmentation of the surrounding skin.  No edema, erythema.  On the left hallux toenail is incurvation present along the medial aspect without any edema, erythema or signs of infection. Bunion unchanged left foot No significant pain on the right ankle today. No pain with calf compression, swelling, warmth, erythema  Assessment: Right hallux onychodystrophy, left hallux ingrown toenail  Plan: -All treatment options discussed with the patient including all alternatives, risks, complications.  -Main concern today is the toenails.  Ankles been doing better now encourage her to continue with stretching, rehab as well as supportive shoes.  Also she will consider bunion surgery in the future. -I debrided the left hallux toenail to remove the symptomatic portion of ingrown toenail with any complications.  Should symptoms continue we can do a partial nail avulsion with chemical matricectomy. -At this time, we discussed total nail removal without chemical matricectomy to the right hallux toenail. Risks and complications were discussed with the patient for which they understand and  verbally consent to the procedure. Under sterile conditions a total  of 3 mL of a mixture of 2% lidocaine plain and 0.5% Marcaine plain was infiltrated in a hallux block fashion. Once anesthetized, the skin was prepped in sterile fashion. A tourniquet was then applied. Next the right hallux nail was excised making sure to remove the entire offending nail border.  There is no extension any hyperpigmentation of the surrounding skin or nail bed.  Once the nail was  Removed, the area was debrided and the underlying skin was intact. The area was irrigated and hemostasis was obtained.  A dry sterile dressing was applied. After application of the dressing the tourniquet was removed and there is found to be an immediate capillary refill time to the digit. The patient tolerated the procedure well any complications. Post procedure instructions were discussed the patient for which he verbally understood. Follow-up in one week for nail check or sooner if any problems are to arise. Discussed signs/symptoms of worsening infection and directed to call the office immediately should any occur or go directly to the emergency room. In the meantime, encouraged to call the office with any questions, concerns, changes symptoms. -Nail sent to [facility] for culture/pathology  [name]

## 2019-11-27 ENCOUNTER — Telehealth: Payer: Self-pay | Admitting: Podiatry

## 2019-11-27 ENCOUNTER — Encounter: Payer: Self-pay | Admitting: Podiatry

## 2019-11-27 ENCOUNTER — Other Ambulatory Visit: Payer: Self-pay | Admitting: Podiatry

## 2019-11-27 MED ORDER — SULFAMETHOXAZOLE-TRIMETHOPRIM 800-160 MG PO TABS: 1.0000 | ORAL_TABLET | Freq: Two times a day (BID) | ORAL | 0 refills | Status: DC

## 2019-11-27 NOTE — Telephone Encounter (Signed)
I called and spoke with the patient- she has some pain and swelling. No pus. I have sent over Bactrim to the pharmacy and encouraged to continue the soaking instructions. If any issues to let me know  Note provided to return to work Thursday.

## 2019-11-27 NOTE — Telephone Encounter (Signed)
Pt is having issues with her ingrown toenail. She is having a lot of swelling and soreness/redness in great toe and 2nd toe. She is also requesting a note to go back work on Thursday. Please give patient a call.

## 2019-11-28 ENCOUNTER — Telehealth: Payer: Self-pay | Admitting: Podiatry

## 2019-11-28 NOTE — Telephone Encounter (Signed)
Ok great, thank you. 

## 2019-11-28 NOTE — Telephone Encounter (Signed)
Patient called in stating the drs note she received on MyChart has typos and asked if you could resend it and also add signature

## 2019-11-30 ENCOUNTER — Encounter: Payer: Self-pay | Admitting: Podiatry

## 2019-11-30 NOTE — Telephone Encounter (Signed)
Done. Can you please call her? Thanks!

## 2019-12-07 ENCOUNTER — Ambulatory Visit (INDEPENDENT_AMBULATORY_CARE_PROVIDER_SITE_OTHER): Admitting: Podiatry

## 2019-12-07 ENCOUNTER — Other Ambulatory Visit: Payer: Self-pay

## 2019-12-07 DIAGNOSIS — M21619 Bunion of unspecified foot: Secondary | ICD-10-CM

## 2019-12-07 DIAGNOSIS — L600 Ingrowing nail: Secondary | ICD-10-CM | POA: Diagnosis not present

## 2019-12-07 DIAGNOSIS — L603 Nail dystrophy: Secondary | ICD-10-CM | POA: Diagnosis not present

## 2019-12-12 NOTE — Progress Notes (Signed)
Subjective: 44 year old female presents the office today for follow-up evaluation after undergoing right total hallux nail avulsion.  She states the toe is feeling better.  Denies any drainage or pus any swelling or redness.  She is soaking Epson salts and applying antibiotic ointment and dressing daily.  She is also considering having left foot bunion surgery but she wants to do this after the first year.  Currently denies any fevers, chills, nausea, vomiting.  No calf pain, chest pain, shortness of breath.   Objective: AAO x3, NAD DP/PT pulses palpable bilaterally, CRT less than 3 seconds  Status post total nail avulsion of the right hallux toenail.  Minimal granulation tissue was noted in the rest there is healed.  There is no edema, erythema or signs of infection.  There is no hyperpigmentation of the nail bed or surrounding skin. Incurvation left hallux toenail without any pain today and there is no edema, erythema or signs of infection. Bunion left foot unchanged. No pain with calf compression, swelling, warmth, erythema  Assessment: Right hallux onychodystrophy, left hallux ingrown toenail  Plan: -All treatment options discussed with the patient including all alternatives, risks, complications.  -In regards to the right hallux toenail area is healing well.  Continue soaking Epson salt soaks cover with antibiotic ointment and a bandage today.  Leave the area open at nighttime.  I did review the nail culture, pathology with her.  It showed simple hematoma.  No evidence of fungus.  Did demonstrate nonpigmented lesions likely benign but we will monitor for any changes. -Ingrown toenail left hallux asymptomatic. -Follow-up in January for surgical consultation of bunion left foot  [name]

## 2020-01-15 ENCOUNTER — Telehealth: Payer: Self-pay | Admitting: Podiatry

## 2020-01-15 NOTE — Telephone Encounter (Signed)
Patient called would like you to call her regarding Angela's message. She also has a question about her ankle.

## 2020-01-15 NOTE — Telephone Encounter (Signed)
Patient called stating that she had toe surgery and is still having pain, and is also having some pain in her left ankle, she would like to stay out of work until 01/28/2020. She has already told her employer that she will not be there next week, because she is in too much pain. Please advise?

## 2020-01-16 ENCOUNTER — Encounter: Payer: Self-pay | Admitting: Podiatry

## 2020-01-16 NOTE — Telephone Encounter (Signed)
Ok to send until then. She can stay out of work until the 27th but please include in the note it is due to ankle pain and she may be having upcoming surgery as well.   I have called and spoken with the patient. Her main concern is the ankle is causing pain and swelling and due to staffing she is not able to take time off of work otherwise. She also wants me to check the toe as well as she is having some pain.   She had to hold off on PT and thinking she may need to do this again.

## 2020-01-17 NOTE — Telephone Encounter (Signed)
I also spoke with patient, its completed. Thank you

## 2020-01-24 ENCOUNTER — Ambulatory Visit (INDEPENDENT_AMBULATORY_CARE_PROVIDER_SITE_OTHER)

## 2020-01-24 ENCOUNTER — Encounter: Payer: Self-pay | Admitting: Podiatry

## 2020-01-24 ENCOUNTER — Ambulatory Visit (INDEPENDENT_AMBULATORY_CARE_PROVIDER_SITE_OTHER): Admitting: Podiatry

## 2020-01-24 ENCOUNTER — Other Ambulatory Visit: Payer: Self-pay

## 2020-01-24 DIAGNOSIS — L603 Nail dystrophy: Secondary | ICD-10-CM | POA: Diagnosis not present

## 2020-01-24 DIAGNOSIS — M2060 Acquired deformities of toe(s), unspecified, unspecified foot: Secondary | ICD-10-CM

## 2020-01-24 DIAGNOSIS — M7752 Other enthesopathy of left foot: Secondary | ICD-10-CM

## 2020-01-24 DIAGNOSIS — M21619 Bunion of unspecified foot: Secondary | ICD-10-CM

## 2020-01-24 DIAGNOSIS — M2061 Acquired deformities of toe(s), unspecified, right foot: Secondary | ICD-10-CM

## 2020-01-24 DIAGNOSIS — M2062 Acquired deformities of toe(s), unspecified, left foot: Secondary | ICD-10-CM

## 2020-01-24 NOTE — Patient Instructions (Signed)

## 2020-01-30 NOTE — Progress Notes (Signed)
Subjective: 44 year old female presents the office today for follow-up evaluation after undergoing right hallux total nail avulsion.  She did have the nail check.  He is in constant discomfort but she denies any redness or drainage or any swelling.  Also presents today for surgical consultation given painful bunion on her left foot/ankle.  She previous is undergone a right first MPJ arthrodesis and has done well in the bunion left side has been causing quite a bit of discomfort.  She is try conservative treatments including, but not limited to, shoe modification, offloading, padding without any significant improvement.  She was to proceed with surgical intervention this time. Denies any systemic complaints such as fevers, chills, nausea, vomiting. No acute changes since last appointment, and no other complaints at this time.   Objective: AAO x3, NAD DP/PT pulses palpable bilaterally, CRT less than 3 seconds Status post total nail avulsion right hallux nail procedure site is healed.  There is no edema, erythema or signs of infection. On the left side there is moderate to severe bunion deformity present.  There is no pain with bridging range of motion but slight restriction when trying to place the first metatarsal into a corrected position.  Mild edema of the first MPJ but there is no erythema or warmth.  Also chronic tenderness along the ankle tendon medially on the flexor tendons.  Tenderness.  Intact.  No edema, erythema.  MMT 5/5. No pain with calf compression, swelling, warmth, erythema  Assessment: 44 year old female status post right total nail avulsion, healing well; left foot Symptomatic bunion, tendinitis   Plan: -All treatment options discussed with the patient including all alternatives, risks, complications.  -Right side hallux toenail is healing well.  Continue current antibiotic ointment and a bandage. -On the left x-rays were obtained reviewed.  Severe bunion is present.  Mild  arthritic changes present first MPJ. -We discussed with conservative as well as surgical treatment options.  This time she was pursuing surgical intervention.  Discussed with her Lapidus bunionectomy versus first MPJ arthrodesis and will determine this intraoperatively based the arthritis of the first MPJ.  Discussed PRP injection to the flexor tendon. -The incision placement as well as the postoperative course was discussed with the patient. I discussed risks of the surgery which include, but not limited to, infection, bleeding, pain, swelling, need for further surgery, delayed or nonhealing, painful or ugly scar, numbness or sensation changes, over/under correction, recurrence, transfer lesions, further deformity, hardware failure, DVT/PE, loss of toe/foot. Patient understands these risks and wishes to proceed with surgery. The surgical consent was reviewed with the patient all 3 pages were signed. No promises or guarantees were given to the outcome of the procedure. All questions were answered to the best of my ability. Before the surgery the patient was encouraged to call the office if there is any further questions. The surgery will be performed at the [facility] on an outpatient basis. -Patient encouraged to call the office with any questions, concerns, change in symptoms.   [name]

## 2020-02-06 ENCOUNTER — Encounter: Payer: Self-pay | Admitting: Podiatry

## 2020-02-08 ENCOUNTER — Ambulatory Visit: Admitting: Podiatry

## 2020-03-03 ENCOUNTER — Other Ambulatory Visit: Payer: Self-pay | Admitting: Podiatry

## 2020-03-03 DIAGNOSIS — M21619 Bunion of unspecified foot: Secondary | ICD-10-CM

## 2020-03-08 ENCOUNTER — Other Ambulatory Visit (HOSPITAL_COMMUNITY)
Admission: RE | Admit: 2020-03-08 | Discharge: 2020-03-08 | Disposition: A | Discharge status: Home / Self Care | Source: Ambulatory Visit | Attending: Podiatry | Admitting: Podiatry

## 2020-03-08 DIAGNOSIS — Z20822 Contact with and (suspected) exposure to covid-19: Secondary | ICD-10-CM | POA: Insufficient documentation

## 2020-03-08 DIAGNOSIS — Z01812 Encounter for preprocedural laboratory examination: Secondary | ICD-10-CM | POA: Insufficient documentation

## 2020-03-08 LAB — SARS CORONAVIRUS 2 (TAT 6-24 HRS): SARS Coronavirus 2: NEGATIVE

## 2020-03-11 ENCOUNTER — Encounter (HOSPITAL_BASED_OUTPATIENT_CLINIC_OR_DEPARTMENT_OTHER): Payer: Self-pay | Admitting: Podiatry

## 2020-03-11 ENCOUNTER — Other Ambulatory Visit: Payer: Self-pay

## 2020-03-11 NOTE — Progress Notes (Addendum)
Spoke w/ via phone for pre-op interview--- Pt Lab needs dos---- Istat and EKG              Lab results------ no COVID test ------ done 03-08-2020 negative result in epic Arrive at ------- 0630 on  03-12-2020 NPO after MN NO Solid Food.  Clear liquids from MN until--- 0530 Medications to take morning of surgery ----- Protonix, Trelegy inhaler Diabetic medication ----- n/a Patient Special Instructions ----- asked pt to bring rescue inhaler Pre-Op special Istructions ----- pt's pcp H&P dated 03-03-2020 received via fax from Dr Ardelle Anton office, placed in epic Patient verbalized understanding of instructions that were given at this phone interview. Patient denies shortness of breath, chest pain, fever, cough at this phone interview.

## 2020-03-12 ENCOUNTER — Encounter (HOSPITAL_BASED_OUTPATIENT_CLINIC_OR_DEPARTMENT_OTHER): Payer: Self-pay | Admitting: Podiatry

## 2020-03-12 ENCOUNTER — Other Ambulatory Visit (HOSPITAL_BASED_OUTPATIENT_CLINIC_OR_DEPARTMENT_OTHER): Payer: Self-pay | Admitting: Podiatry

## 2020-03-12 ENCOUNTER — Encounter (HOSPITAL_BASED_OUTPATIENT_CLINIC_OR_DEPARTMENT_OTHER)
Admission: RE | Disposition: A | Discharge status: Home / Self Care | Payer: Self-pay | Source: Home / Self Care | Attending: Podiatry

## 2020-03-12 ENCOUNTER — Ambulatory Visit (HOSPITAL_BASED_OUTPATIENT_CLINIC_OR_DEPARTMENT_OTHER): Admitting: Certified Registered"

## 2020-03-12 ENCOUNTER — Encounter: Payer: Self-pay | Admitting: Podiatry

## 2020-03-12 ENCOUNTER — Other Ambulatory Visit: Payer: Self-pay

## 2020-03-12 ENCOUNTER — Ambulatory Visit (HOSPITAL_BASED_OUTPATIENT_CLINIC_OR_DEPARTMENT_OTHER)
Admission: RE | Admit: 2020-03-12 | Discharge: 2020-03-12 | Disposition: A | Discharge status: Home / Self Care | Attending: Podiatry | Admitting: Podiatry

## 2020-03-12 DIAGNOSIS — Z7951 Long term (current) use of inhaled steroids: Secondary | ICD-10-CM | POA: Diagnosis not present

## 2020-03-12 DIAGNOSIS — I10 Essential (primary) hypertension: Secondary | ICD-10-CM | POA: Diagnosis not present

## 2020-03-12 DIAGNOSIS — Z79899 Other long term (current) drug therapy: Secondary | ICD-10-CM | POA: Diagnosis not present

## 2020-03-12 DIAGNOSIS — M2012 Hallux valgus (acquired), left foot: Secondary | ICD-10-CM | POA: Diagnosis not present

## 2020-03-12 DIAGNOSIS — R7303 Prediabetes: Secondary | ICD-10-CM | POA: Insufficient documentation

## 2020-03-12 DIAGNOSIS — M21612 Bunion of left foot: Secondary | ICD-10-CM | POA: Insufficient documentation

## 2020-03-12 HISTORY — DX: Prediabetes: R73.03

## 2020-03-12 HISTORY — DX: Personal history of other specified conditions: Z87.898

## 2020-03-12 HISTORY — DX: Personal history of other diseases of the musculoskeletal system and connective tissue: Z87.39

## 2020-03-12 HISTORY — PX: HALLUX FUSION: SHX6621

## 2020-03-12 HISTORY — DX: Mild intermittent asthma, uncomplicated: J45.20

## 2020-03-12 HISTORY — DX: Cardiac murmur, unspecified: R01.1

## 2020-03-12 HISTORY — DX: Gastro-esophageal reflux disease without esophagitis: K21.9

## 2020-03-12 HISTORY — DX: Essential (primary) hypertension: I10

## 2020-03-12 HISTORY — DX: Allergic rhinitis, unspecified: J30.9

## 2020-03-12 HISTORY — PX: HALLUX VALGUS LAPIDUS: SHX6626

## 2020-03-12 LAB — POCT I-STAT, CHEM 8
BUN: 15 mg/dL (ref 6–20)
Calcium, Ion: 1.26 mmol/L (ref 1.15–1.40)
Chloride: 103 mmol/L (ref 98–111)
Creatinine, Ser: 0.7 mg/dL (ref 0.44–1.00)
Glucose, Bld: 97 mg/dL (ref 70–99)
HCT: 40 % (ref 36.0–46.0)
Hemoglobin: 13.6 g/dL (ref 12.0–15.0)
Potassium: 4 mmol/L (ref 3.5–5.1)
Sodium: 141 mmol/L (ref 135–145)
TCO2: 26 mmol/L (ref 22–32)

## 2020-03-12 LAB — POCT PREGNANCY, URINE: Preg Test, Ur: NEGATIVE

## 2020-03-12 SURGERY — BUNIONECTOMY, LAPIDUS
Anesthesia: General | Site: Foot | Laterality: Left

## 2020-03-12 MED ORDER — OXYCODONE-ACETAMINOPHEN 5-325 MG PO TABS: 1.0000 | ORAL_TABLET | Freq: Four times a day (QID) | ORAL | 0 refills | Status: DC | PRN

## 2020-03-12 MED ORDER — LIDOCAINE HCL (PF) 2 % IJ SOLN
INTRAMUSCULAR | Status: AC
  Filled 2020-03-12: qty 5

## 2020-03-12 MED ORDER — PROPOFOL 10 MG/ML IV BOLUS
INTRAVENOUS | Status: AC
  Filled 2020-03-12: qty 40

## 2020-03-12 MED ORDER — CLINDAMYCIN PHOSPHATE 900 MG/50ML IV SOLN
INTRAVENOUS | Status: AC
  Filled 2020-03-12: qty 50

## 2020-03-12 MED ORDER — FENTANYL CITRATE (PF) 100 MCG/2ML IJ SOLN
INTRAMUSCULAR | Status: AC
  Filled 2020-03-12: qty 2

## 2020-03-12 MED ORDER — FENTANYL CITRATE (PF) 100 MCG/2ML IJ SOLN
25.0000 ug | INTRAMUSCULAR | Status: DC | PRN
  Administered 2020-03-12: 50 ug via INTRAVENOUS

## 2020-03-12 MED ORDER — CHLORHEXIDINE GLUCONATE CLOTH 2 % EX PADS: 6.0000 | MEDICATED_PAD | Freq: Once | CUTANEOUS | Status: DC

## 2020-03-12 MED ORDER — FENTANYL CITRATE (PF) 100 MCG/2ML IJ SOLN
INTRAMUSCULAR | Status: DC | PRN
  Administered 2020-03-12: 50 ug via INTRAVENOUS
  Administered 2020-03-12 (×3): 25 ug via INTRAVENOUS

## 2020-03-12 MED ORDER — AMISULPRIDE (ANTIEMETIC) 5 MG/2ML IV SOLN
INTRAVENOUS | Status: AC
  Filled 2020-03-12: qty 4

## 2020-03-12 MED ORDER — ONDANSETRON HCL 4 MG/2ML IJ SOLN
INTRAMUSCULAR | Status: DC | PRN
  Administered 2020-03-12 (×2): 4 mg via INTRAVENOUS

## 2020-03-12 MED ORDER — CLINDAMYCIN PHOSPHATE 900 MG/50ML IV SOLN
900.0000 mg | INTRAVENOUS | Status: AC
  Administered 2020-03-12: 900 mg via INTRAVENOUS

## 2020-03-12 MED ORDER — LACTATED RINGERS IV SOLN: INTRAVENOUS | Status: DC

## 2020-03-12 MED ORDER — CLINDAMYCIN HCL 300 MG PO CAPS: 300.0000 mg | ORAL_CAPSULE | Freq: Three times a day (TID) | ORAL | 0 refills | Status: DC

## 2020-03-12 MED ORDER — MIDAZOLAM HCL 5 MG/5ML IJ SOLN
INTRAMUSCULAR | Status: DC | PRN
  Administered 2020-03-12: 2 mg via INTRAVENOUS

## 2020-03-12 MED ORDER — MIDAZOLAM HCL 2 MG/2ML IJ SOLN
INTRAMUSCULAR | Status: AC
  Filled 2020-03-12: qty 2

## 2020-03-12 MED ORDER — PROMETHAZINE HCL 25 MG PO TABS: 25.0000 mg | ORAL_TABLET | Freq: Three times a day (TID) | ORAL | 0 refills | Status: DC | PRN

## 2020-03-12 MED ORDER — ONDANSETRON HCL 4 MG/2ML IJ SOLN
INTRAMUSCULAR | Status: AC
  Filled 2020-03-12: qty 4

## 2020-03-12 MED ORDER — DEXAMETHASONE SODIUM PHOSPHATE 10 MG/ML IJ SOLN
INTRAMUSCULAR | Status: AC
  Filled 2020-03-12: qty 1

## 2020-03-12 MED ORDER — LIDOCAINE 2% (20 MG/ML) 5 ML SYRINGE
INTRAMUSCULAR | Status: DC | PRN
  Administered 2020-03-12: 100 mg via INTRAVENOUS

## 2020-03-12 MED ORDER — AMISULPRIDE (ANTIEMETIC) 5 MG/2ML IV SOLN
10.0000 mg | Freq: Once | INTRAVENOUS | Status: AC | PRN
  Administered 2020-03-12: 10 mg via INTRAVENOUS

## 2020-03-12 MED ORDER — DEXAMETHASONE SODIUM PHOSPHATE 10 MG/ML IJ SOLN
INTRAMUSCULAR | Status: DC | PRN
  Administered 2020-03-12: 10 mg via INTRAVENOUS

## 2020-03-12 MED ORDER — OXYCODONE HCL 5 MG PO TABS: 5.0000 mg | ORAL_TABLET | Freq: Once | ORAL | Status: DC | PRN

## 2020-03-12 MED ORDER — BUPIVACAINE HCL 0.5 % IJ SOLN
INTRAMUSCULAR | Status: DC | PRN
  Administered 2020-03-12 (×2): 10 mL

## 2020-03-12 MED ORDER — OXYCODONE HCL 5 MG/5ML PO SOLN: 5.0000 mg | Freq: Once | ORAL | Status: DC | PRN

## 2020-03-12 MED ORDER — ONDANSETRON HCL 4 MG/2ML IJ SOLN: 4.0000 mg | Freq: Once | INTRAMUSCULAR | Status: DC | PRN

## 2020-03-12 MED ORDER — 0.9 % SODIUM CHLORIDE (POUR BTL) OPTIME
TOPICAL | Status: DC | PRN
  Administered 2020-03-12: 500 mL

## 2020-03-12 MED ORDER — PROPOFOL 10 MG/ML IV BOLUS
INTRAVENOUS | Status: DC | PRN
  Administered 2020-03-12: 200 mg via INTRAVENOUS

## 2020-03-12 MED FILL — PROMETHAZINE 25 MG TABLET: 25 | 6 days supply | Qty: 20 | Fill #0

## 2020-03-12 MED FILL — OXYCODONE-APAP 5-325MG: 5-325 | 2 days supply | Qty: 20 | Fill #0

## 2020-03-12 MED FILL — CLINDAMYCIN HCL 300 MG CAP: 300 | 3 days supply | Qty: 9 | Fill #0

## 2020-03-12 SURGICAL SUPPLY — 68 items
APL PRP STRL LF DISP 70% ISPRP (MISCELLANEOUS) ×1
BLADE AVERAGE 25X9 (BLADE) ×2 IMPLANT
BLADE HEX COATED 2.75 (ELECTRODE) ×2 IMPLANT
BLADE SURG 15 STRL LF DISP TIS (BLADE) ×2 IMPLANT
BLADE SURG 15 STRL SS (BLADE) ×4
BNDG CMPR 9X4 STRL LF SNTH (GAUZE/BANDAGES/DRESSINGS) ×1
BNDG ELASTIC 3X5.8 VLCR STR LF (GAUZE/BANDAGES/DRESSINGS) ×2 IMPLANT
BNDG ELASTIC 4X5.8 VLCR STR LF (GAUZE/BANDAGES/DRESSINGS) ×2 IMPLANT
BNDG ESMARK 4X9 LF (GAUZE/BANDAGES/DRESSINGS) ×2 IMPLANT
BNDG GAUZE ELAST 4 BULKY (GAUZE/BANDAGES/DRESSINGS) ×2 IMPLANT
BONE STAPLE KIT 18X18X18 (Staple) ×2 IMPLANT
CHLORAPREP W/TINT 26 (MISCELLANEOUS) ×2 IMPLANT
COVER BACK TABLE 60X90IN (DRAPES) ×2 IMPLANT
COVER WAND RF STERILE (DRAPES) ×2 IMPLANT
CUFF TOURN SGL QUICK 18X4 (TOURNIQUET CUFF) IMPLANT
CUFF TOURN SGL QUICK 24 (TOURNIQUET CUFF)
CUFF TRNQT CYL 24X4X16.5-23 (TOURNIQUET CUFF) IMPLANT
DRAPE 3/4 80X56 (DRAPES) ×2 IMPLANT
DRAPE C-ARM 35X43 STRL (DRAPES) IMPLANT
DRAPE EXTREMITY T 121X128X90 (DISPOSABLE) ×2 IMPLANT
DRAPE U-SHAPE 47X51 STRL (DRAPES) ×2 IMPLANT
ELECT REM PT RETURN 9FT ADLT (ELECTROSURGICAL) ×2
ELECTRODE REM PT RTRN 9FT ADLT (ELECTROSURGICAL) ×1 IMPLANT
GAUZE SPONGE 4X4 12PLY STRL (GAUZE/BANDAGES/DRESSINGS) ×2 IMPLANT
GAUZE SPONGE 4X4 12PLY STRL LF (GAUZE/BANDAGES/DRESSINGS) ×2 IMPLANT
GAUZE XEROFORM 1X8 LF (GAUZE/BANDAGES/DRESSINGS) ×2 IMPLANT
GLOVE SRG 8 PF TXTR STRL LF DI (GLOVE) ×1 IMPLANT
GLOVE SURG ENC MOIS LTX SZ7.5 (GLOVE) ×2 IMPLANT
GLOVE SURG UNDER POLY LF SZ8 (GLOVE) ×2
GOWN STRL REUS W/TWL LRG LVL3 (GOWN DISPOSABLE) ×2 IMPLANT
K-WIRE DBL END TROCAR 6X.062 (WIRE) ×4
K-WIRE SURGICAL 1.6X102 (WIRE) IMPLANT
KIT INSTRUMENT DYNAFORCE PLATE (KITS) ×2 IMPLANT
KIT STAPLE BONE HIMAX 18X18X18 (Staple) ×1 IMPLANT
KIT TURNOVER CYSTO (KITS) ×2 IMPLANT
KWIRE DBL END TROCAR 6X.062 (WIRE) ×2 IMPLANT
NEEDLE HYPO 25X1 1.5 SAFETY (NEEDLE) ×4 IMPLANT
NS IRRIG 1000ML POUR BTL (IV SOLUTION) IMPLANT
PACK BASIN DAY SURGERY FS (CUSTOM PROCEDURE TRAY) ×2 IMPLANT
PENCIL SMOKE EVACUATOR (MISCELLANEOUS) ×2 IMPLANT
PIN CAPS ORTHO GREEN .062 (PIN) IMPLANT
PLATE MTP STANDARD 18 (Plate) ×2 IMPLANT
PLATE MTP STD 18 (Plate) ×1 IMPLANT
REAMER CUP/CONE DYNAFORCE 18 (MISCELLANEOUS) ×2 IMPLANT
SCREW BN 12X3.5XSTRL TI (Screw) ×1 IMPLANT
SCREW LOCKING POLYAXIAL 3.5X16 (Screw) ×2 IMPLANT
SCREW NL MOTOBAND 3.5X18 (Screw) ×2 IMPLANT
SCREW NLOCK 3.5X12 (Screw) ×2 IMPLANT
SCREW POLYAXIL LOCK 3.5X12 (Screw) ×4 IMPLANT
STAPLER VISISTAT 35W (STAPLE) IMPLANT
STOCKINETTE 6  STRL (DRAPES) ×1
STOCKINETTE 6 STRL (DRAPES) ×1 IMPLANT
SUCTION FRAZIER HANDLE 10FR (MISCELLANEOUS) ×1
SUCTION TUBE FRAZIER 10FR DISP (MISCELLANEOUS) ×1 IMPLANT
SUT ETHILON 4 0 PS 2 18 (SUTURE) ×2 IMPLANT
SUT MNCRL AB 3-0 PS2 18 (SUTURE) ×2 IMPLANT
SUT MNCRL AB 4-0 PS2 18 (SUTURE) ×2 IMPLANT
SUT MON AB 5-0 PS2 18 (SUTURE) ×4 IMPLANT
SUT PROLENE 4 0 PS 2 18 (SUTURE) IMPLANT
SUT VIC AB 2-0 SH 27 (SUTURE) ×2
SUT VIC AB 2-0 SH 27XBRD (SUTURE) ×1 IMPLANT
SYR BULB EAR ULCER 3OZ GRN STR (SYRINGE) ×2 IMPLANT
SYR CONTROL 10ML LL (SYRINGE) ×4 IMPLANT
TOWEL OR 17X26 10 PK STRL BLUE (TOWEL DISPOSABLE) ×2 IMPLANT
TRAY DSU PREP LF (CUSTOM PROCEDURE TRAY) ×2 IMPLANT
UNDERPAD 30X36 HEAVY ABSORB (UNDERPADS AND DIAPERS) ×2 IMPLANT
YANKAUER SUCT BULB TIP NO VENT (SUCTIONS) IMPLANT
cup/cone reamer 18mm ×2 IMPLANT

## 2020-03-12 NOTE — Discharge Instructions (Signed)
Resume all home medications as prior Keep foot iced/elevated Wear surgical boot at all times See written instructions   Post Anesthesia Home Care Instructions  Activity: Get plenty of rest for the remainder of the day. A responsible individual must stay with you for 24 hours following the procedure.  For the next 24 hours, DO NOT: -Drive a car -Advertising copywriter -Drink alcoholic beverages -Take any medication unless instructed by your physician -Make any legal decisions or sign important papers.  Meals: Start with liquid foods such as gelatin or soup. Progress to regular foods as tolerated. Avoid greasy, spicy, heavy foods. If nausea and/or vomiting occur, drink only clear liquids until the nausea and/or vomiting subsides. Call your physician if vomiting continues.  Special Instructions/Symptoms: Your throat may feel dry or sore from the anesthesia or the breathing tube placed in your throat during surgery. If this causes discomfort, gargle with warm salt water. The discomfort should disappear within 24 hours.

## 2020-03-12 NOTE — Anesthesia Procedure Notes (Signed)
Procedure Name: LMA Insertion Date/Time: 03/12/2020 8:43 AM Performed by: Marny Lowenstein, CRNA Pre-anesthesia Checklist: Patient identified, Emergency Drugs available, Suction available and Patient being monitored Patient Re-evaluated:Patient Re-evaluated prior to induction Oxygen Delivery Method: Circle system utilized Preoxygenation: Pre-oxygenation with 100% oxygen Induction Type: IV induction Ventilation: Mask ventilation without difficulty LMA: LMA inserted LMA Size: 4.0 Number of attempts: 1 Airway Equipment and Method: Patient positioned with wedge pillow Placement Confirmation: positive ETCO2 and breath sounds checked- equal and bilateral Tube secured with: Tape Dental Injury: Teeth and Oropharynx as per pre-operative assessment

## 2020-03-12 NOTE — Transfer of Care (Signed)
Immediate Anesthesia Transfer of Care Note  Patient: Brittney Marshall  Procedure(s) Performed: HALLUX VALGUS LAPIDUS INCLUDING BUNIONECTOMY OR (Left Foot) HALLUX FUSION METATARSALPHALANGEAL JOINT (Left Foot)  Patient Location: PACU  Anesthesia Type:General  Level of Consciousness: drowsy and patient cooperative  Airway & Oxygen Therapy: Patient Spontanous Breathing and Patient connected to nasal cannula oxygen  Post-op Assessment: Report given to RN and Post -op Vital signs reviewed and stable  Post vital signs: Reviewed and stable  Last Vitals:  Vitals Value Taken Time  BP 128/68 03/12/20 1020  Temp    Pulse 109 03/12/20 1022  Resp 23 03/12/20 1022  SpO2 99 % 03/12/20 1022  Vitals shown include unvalidated device data.  Last Pain:  Vitals:   03/12/20 0724  TempSrc: Oral  PainSc: 0-No pain      Patients Stated Pain Goal: 3 (03/12/20 0724)  Complications: No complications documented.

## 2020-03-12 NOTE — Anesthesia Postprocedure Evaluation (Signed)
Anesthesia Post Note  Patient: Brittney Marshall  Procedure(s) Performed: HALLUX VALGUS LAPIDUS INCLUDING BUNIONECTOMY OR (Left Foot) HALLUX FUSION METATARSALPHALANGEAL JOINT (Left Foot)     Patient location during evaluation: PACU Anesthesia Type: General Level of consciousness: awake and alert Pain management: pain level controlled Vital Signs Assessment: post-procedure vital signs reviewed and stable Respiratory status: spontaneous breathing, nonlabored ventilation and respiratory function stable Cardiovascular status: blood pressure returned to baseline and stable Postop Assessment: no apparent nausea or vomiting Anesthetic complications: no   No complications documented.  Last Vitals:  Vitals:   03/12/20 1035 03/12/20 1045  BP:  (!) 142/84  Pulse: 87 82  Resp: 15 15  Temp:    SpO2: 100% 98%    Last Pain:  Vitals:   03/12/20 1020  TempSrc:   PainSc: 0-No pain                 Lucretia Kern

## 2020-03-12 NOTE — Brief Op Note (Signed)
03/12/2020  10:13 AM  PATIENT:  Brittney Marshall  45 y.o. female  PRE-OPERATIVE DIAGNOSIS:  BUNION  POST-OPERATIVE DIAGNOSIS:  BUNION  PROCEDURE:  LEFT 1st MTPJ arthrodesis  SURGEON:  Surgeon(s) and Role:    * Wagoner, Lesia Sago, DPM - Primary  PHYSICIAN ASSISTANT:   ASSISTANTS: none   ANESTHESIA:   general  EBL:  5 mL   BLOOD ADMINISTERED:none  DRAINS: none   LOCAL MEDICATIONS USED:  OTHER 20 cc marcaine plain total during the procedure; 10cc before the case and 10cc at the conclusion  SPECIMEN:  No Specimen  DISPOSITION OF SPECIMEN:  N/A  COUNTS:  YES  TOURNIQUET:   Total Tourniquet Time Documented: Calf (Left) - 69 minutes Total: Calf (Left) - 69 minutes   DICTATION: .Reubin Milan Dictation  PLAN OF CARE: Discharge to home after PACU  PATIENT DISPOSITION:  PACU - hemodynamically stable.   Delay start of Pharmacological VTE agent (>24hrs) due to surgical blood loss or risk of bleeding: no

## 2020-03-12 NOTE — Op Note (Signed)
PATIENT:  Brittney Marshall  45 y.o. female  PRE-OPERATIVE DIAGNOSIS:  BUNION  POST-OPERATIVE DIAGNOSIS:  BUNION  PROCEDURE:  LEFT 1st MTPJ arthrodesis  SURGEON:  Surgeon(s) and Role:    * Kaulder Zahner, Lesia Sago, DPM - Primary  PHYSICIAN ASSISTANT:   ASSISTANTS: none   ANESTHESIA:   general  EBL:  5 mL   BLOOD ADMINISTERED:none  DRAINS: none   LOCAL MEDICATIONS USED:  OTHER 20 cc marcaine plain total during the procedure; 10cc before the case and 10cc at the conclusion  SPECIMEN:  No Specimen  DISPOSITION OF SPECIMEN:  N/A  COUNTS:  YES  TOURNIQUET:   Total Tourniquet Time Documented: Calf (Left) - 69 minutes Total: Calf (Left) - 69 minutes   DICTATION: .Reubin Milan Dictation  PLAN OF CARE: Discharge to home after PACU  PATIENT DISPOSITION:  PACU - hemodynamically stable.   Delay start of Pharmacological VTE agent (>24hrs) due to surgical blood loss or risk of bleeding: no  Indications for surgery: Patient presents the office with painful bunion on her left foot.  She previously had bunion surgery by another provider for bunion right foot and she had a first MPJ arthrodesis.  On the left foot she is attempted numerous conservative treatments including shoe modifications, offloading cardiovascular pain improvement and she was to proceed with surgical intervention.  X-rays did show some mild arthritis.  I discussed with her first MPJ arthrodesis versus Lapidus bunionectomy.  We will make a determination intraoperatively depending on the arthritis in the first MPJ.  We discussed both surgeries as well as alternatives risks and complications.  No promises or guarantees were given the fact of the procedure all questions were answered to the best my ability.  The procedure in detail: The patient was both verbally and visually identified by myself, the nursing staff, the anesthesia staff.  She was then transferred the operating room via stretcher and placed in the  operating table in supine position.  A well-padded calf tourniquet was applied making sure to pad all bony prominences.  After LMA was placed 10 cc of Marcaine plain was infiltrated in a regional block fashion.  Timeout was performed prior to this.  Left lower extremities and scrubbed prepped and draped in normal sterile fashion.  Secondary timeout was performed.  The left lower extremity was exsanguinated with an Esmarch bandage and the pneumatic calf tourniquet was inflated to 250 mmHg.  A incision was made medial to the extensor tendon on the distal one half of the first metatarsal extending onto the proximal phalanx.  The incision was made with a #15 by scalpel the epidermis and the dermis.  The subcutaneous tissues were then bluntly stripped dissected making sure retract all vital neurovascular structures all bleeders were cauterized as necessary.  At this time a deep incision was made along the first MPJ and soft tissue structures were freed from the first IPJ exposing the joint.  There is found to be thinning of the joint as well as central subchondral lesion noted within the first metatarsal head.  Given the arthritic changes to proceed with the first MPJ arthrodesis.  K wire was inserted into the metatarsal head in both the head of the first metatarsal to the base of the phalanx was then reamed to remove the cartilage from the subchondral bone plate.  Once this was completed I utilized a 0.062 K wire for subchondral drilling.  The first MPJ was placed into a rectus position.  I utilized a [employer] first MPJ arthrodesis plate  and this was temporally fixated to the bone.  Fluoroscopy was utilized to confirm placement.  Once hardware is in the appropriate position as well as the toe I inserted an 18 x 18 mm staple across the joint through the plate.  Fluoroscopy was again utilized to confirm placement.  Initially one nonlocking screw was placed distally as well as one locking screw.  The nonlocking screw  was replaced with a locking screw.  At this time one nonlocking screw was inserted on the proximal portion of the plate to help the plate contour the bone and to the other hole was filled with a locking screw.  At this time the arthrodesis site appeared to be stable and hardware intact.  Fluoroscopy was utilized to confirm adequate reduction of deformity as well as hardware placement.  I did remove some prominence on the medial aspect of the first metatarsal head with sagittal bone saw.  At this time the incision was copiously irrigated with saline and hemostasis achieved.  Incision was then closed in layered fashion with deep structures closed with 3-0 Monocryl the subcu tissues with 4-0 Monocryl and skin was then closed with 5-0 Monocryl in a running subcuticular fashion.  Xeroform was applied followed by dry sterile dressing.  The tourniquet was released and there was found to be immediate capillary return to all the digits.  She was awoken from anesthesia and found to tolerate the procedure without any complications.  She was transferred to PACU vital signs stable and vascular status intact.

## 2020-03-12 NOTE — Progress Notes (Signed)
Seen in pre-op at [facility]. Scheduled for left foot bunion correction, 1st MTPJ arthrodesis versus Lapidus. We discussed the surgery and postop course. All alternatives, risks, complications discussed. She has no further questions and consent was signed. NPO. Will proceed as planned. Full H&P in the chart.

## 2020-03-12 NOTE — Anesthesia Preprocedure Evaluation (Signed)
Anesthesia Evaluation  Patient identified by MRN, date of birth, ID band Patient awake    Reviewed: Allergy & Precautions, NPO status , Patient's Chart, lab work & pertinent test results  History of Anesthesia Complications Negative for: history of anesthetic complications  Airway Mallampati: II  TM Distance: >3 FB Neck ROM: Full    Dental  (+) Teeth Intact   Pulmonary asthma ,    Pulmonary exam normal        Cardiovascular hypertension, Normal cardiovascular exam     Neuro/Psych negative neurological ROS  negative psych ROS   GI/Hepatic Neg liver ROS, GERD  ,  Endo/Other  Morbid obesity  Renal/GU negative Renal ROS  negative genitourinary   Musculoskeletal  (+) Arthritis ,   Abdominal   Peds  Hematology negative hematology ROS (+)   Anesthesia Other Findings   Reproductive/Obstetrics                             Anesthesia Physical Anesthesia Plan  ASA: III  Anesthesia Plan: General   Post-op Pain Management:    Induction: Intravenous  PONV Risk Score and Plan: 3 and Ondansetron, Dexamethasone, Midazolam and Treatment may vary due to age or medical condition  Airway Management Planned: LMA  Additional Equipment: None  Intra-op Plan:   Post-operative Plan: Extubation in OR  Informed Consent: I have reviewed the patients History and Physical, chart, labs and discussed the procedure including the risks, benefits and alternatives for the proposed anesthesia with the patient or authorized representative who has indicated his/her understanding and acceptance.     Dental advisory given  Plan Discussed with:   Anesthesia Plan Comments:         Anesthesia Quick Evaluation

## 2020-03-14 ENCOUNTER — Encounter (HOSPITAL_BASED_OUTPATIENT_CLINIC_OR_DEPARTMENT_OTHER): Payer: Self-pay | Admitting: Podiatry

## 2020-03-14 ENCOUNTER — Telehealth: Payer: Self-pay | Admitting: *Deleted

## 2020-03-14 NOTE — Telephone Encounter (Signed)
Called and spoke with the patient yesterday and stated that I was calling to see how the patient was doing after having surgery with Dr Ardelle Anton and the patient stated that she was doing good and that she took a pain pill at 7:30 am that morning and the pain was about a 5 and patient could wiggle her toes and was icing and elevating and there was no fever or chills and not any nausea and I stated to call the office if any concerns or questions. Brittney Marshall

## 2020-03-17 ENCOUNTER — Other Ambulatory Visit (HOSPITAL_COMMUNITY): Payer: Self-pay | Admitting: Podiatry

## 2020-03-17 ENCOUNTER — Ambulatory Visit (INDEPENDENT_AMBULATORY_CARE_PROVIDER_SITE_OTHER): Admitting: Podiatry

## 2020-03-17 ENCOUNTER — Ambulatory Visit (INDEPENDENT_AMBULATORY_CARE_PROVIDER_SITE_OTHER)

## 2020-03-17 ENCOUNTER — Other Ambulatory Visit: Payer: Self-pay

## 2020-03-17 DIAGNOSIS — M21612 Bunion of left foot: Secondary | ICD-10-CM

## 2020-03-17 DIAGNOSIS — M21619 Bunion of unspecified foot: Secondary | ICD-10-CM

## 2020-03-17 DIAGNOSIS — M2060 Acquired deformities of toe(s), unspecified, unspecified foot: Secondary | ICD-10-CM

## 2020-03-17 MED ORDER — IBUPROFEN 800 MG PO TABS: 800.0000 mg | ORAL_TABLET | Freq: Three times a day (TID) | ORAL | 0 refills | Status: DC | PRN

## 2020-03-17 MED FILL — IBUPROFEN 800 MG TABS: 800 | 10 days supply | Qty: 30 | Fill #0

## 2020-03-18 ENCOUNTER — Encounter: Payer: Self-pay | Admitting: Podiatry

## 2020-03-19 ENCOUNTER — Telehealth: Payer: Self-pay | Admitting: Podiatry

## 2020-03-19 NOTE — Telephone Encounter (Signed)
Patient called inquiring about office notes from surgical procedure, stated the health summary is needed and has requested they be sent over via MyChart so she can submit information to Short Term Disability. She has stated she can also pick up as well, Please Advise

## 2020-03-19 NOTE — Progress Notes (Signed)
Subjective: Brittney Marshall is a 45 y.o. is seen today in office s/p Left 1st MTPJ arthrodesis preformed on 03/12/2020.  Overall states that her pain is improving.  She has been in the cam boot and transcatheter is much as possible.  No recent injury or falls.  Denies any systemic complaints such as fevers, chills, nausea, vomiting. No calf pain, chest pain, shortness of breath.   Objective: General: No acute distress, AAOx3  DP/PT pulses palpable 2/4, CRT < 3 sec to all digits.  Protective sensation intact. Motor function intact.  LEFT foot: Incision is well coapted without any evidence of dehiscence with sutures intact. There is no surrounding erythema, ascending cellulitis, fluctuance, crepitus, malodor, drainage/purulence. There is mild edema around the surgical site. There is mild pain along the surgical site. The toe is in a rectus position.  No other areas of tenderness to bilateral lower extremities.  No other open lesions or pre-ulcerative lesions.  No pain with calf compression, swelling, warmth, erythema.   Assessment and Plan:  Status post left foot surgery, doing well with no complications   -Treatment options discussed including all alternatives, risks, and complications -X-rays obtained reviewed.  Status post first MPJ arthrodesis with uncomplicated factors of hardware intact. -Antibiotic ointment and the pins applied.  Keep dressing clean, dry, intact. -Remain in cam boot.  Nonweightbearing -Ice/elevation -Pain medication as needed. -Patient is still not able to return to work and we will continue with FMLA. -Monitor for any clinical signs or symptoms of infection and DVT/PE and directed to call the office immediately should any occur or go to the ER. -Follow-up as scheduled or sooner if any problems arise. In the meantime, encouraged to call the office with any questions, concerns, change in symptoms.   Ovid Curd, DPM

## 2020-03-19 NOTE — Telephone Encounter (Signed)
Brittney Marshall- can you please assist her with this? thanks

## 2020-03-21 ENCOUNTER — Ambulatory Visit (INDEPENDENT_AMBULATORY_CARE_PROVIDER_SITE_OTHER)

## 2020-03-21 ENCOUNTER — Other Ambulatory Visit: Payer: Self-pay

## 2020-03-21 ENCOUNTER — Telehealth: Payer: Self-pay | Admitting: Podiatry

## 2020-03-21 ENCOUNTER — Other Ambulatory Visit: Payer: Self-pay | Admitting: Podiatry

## 2020-03-21 DIAGNOSIS — M79652 Pain in left thigh: Secondary | ICD-10-CM

## 2020-03-21 NOTE — Telephone Encounter (Signed)
Patient stated she has been experiencing some discomfort in thigh and has concerns/questions regarding issue. Stated she feel as if the pain is coming from the constant elevation and requested return call from nurse, Please Advise

## 2020-03-21 NOTE — Telephone Encounter (Signed)
I have called and spoken with her. She gets thigh pain with elevation and if she turns a certain way. No calf pain. Sounds muscular as she is keeping the leg elevated but ordered a venous duplex to rule out DVT. I have put the order in and personally spoken with radiology at [facility] and they will get her in today.

## 2020-03-28 ENCOUNTER — Other Ambulatory Visit: Payer: Self-pay

## 2020-03-28 ENCOUNTER — Ambulatory Visit (INDEPENDENT_AMBULATORY_CARE_PROVIDER_SITE_OTHER): Admitting: Podiatry

## 2020-03-28 DIAGNOSIS — M21619 Bunion of unspecified foot: Secondary | ICD-10-CM

## 2020-03-31 NOTE — Progress Notes (Signed)
Subjective: Brittney Marshall is a 45 y.o. is seen today in office s/p Left 1st MTPJ arthrodesis preformed on 03/12/2020.  States that she is doing well.  She still has some swelling some discomfort but overall appears to be improving.  She denies any recent injury or falls and she been wearing the cam boot and nonweightbearing.  She has no other concerns today. Denies any systemic complaints such as fevers, chills, nausea, vomiting. No calf pain, chest pain, shortness of breath.   Objective: General: No acute distress, AAOx3  DP/PT pulses palpable 2/4, CRT < 3 sec to all digits.  Protective sensation intact. Motor function intact.  LEFT foot: Incision is well coapted without any evidence of dehiscence with sutures intact. There is no surrounding erythema, ascending cellulitis, fluctuance, crepitus, malodor, drainage/purulence. There is still edema present around the surgical site. There is mild pain along the surgical site. The toe is in a rectus position.  No other areas of tenderness to bilateral lower extremities.  No other open lesions or pre-ulcerative lesions.  No pain with calf compression, swelling, warmth, erythema.   Assessment and Plan:  Status post left foot surgery, doing well with no complications   -Treatment options discussed including all alternatives, risks, and complications -Overall she is doing better but still in some discomfort swelling.  She gets her to wash the area soap and water and dry thoroughly.  Apply antibiotic ointment and a bandage.  This was also applied today.  Continue ice elevation as well as compression.  Wear cam boot, nonweightbearing. -Monitor for any clinical signs or symptoms of infection and DVT/PE and directed to call the office immediately should any occur or go to the ER. -Follow-up as scheduled or sooner if any problems arise. In the meantime, encouraged to call the office with any questions, concerns, change in symptoms.   X-ray next appointment  left foot  Ovid Curd, DPM

## 2020-04-08 ENCOUNTER — Telehealth: Payer: Self-pay | Admitting: Podiatry

## 2020-04-08 NOTE — Telephone Encounter (Signed)
Herbert Seta- can you please follow up with her? She had some leg pain previously and ultrasound was negative for DVT. It is probably the boot. If there is any calf cramping, swelling, warmth or any SOB she should go to the ER.

## 2020-04-08 NOTE — Telephone Encounter (Signed)
Patient said she is having leg pain and she is elevating and icing it. She is taking ibprofen and she wanted to make sure that this is normal.

## 2020-04-08 NOTE — Telephone Encounter (Signed)
Spoke to patient at length and discussed symptoms. The soreness she is experiencing sounds very normal. Reassured. Advised to watch for signe of DVT - swelling, heat, severe cramping - patient voiced understanding. She will keep her scheduled appointment for Friday in [place].

## 2020-04-08 NOTE — Telephone Encounter (Signed)
Patient has called back to follow up-she just wanted to make sure its normal to have this soreness in her leg

## 2020-04-10 ENCOUNTER — Telehealth: Payer: Self-pay | Admitting: Podiatry

## 2020-04-10 ENCOUNTER — Other Ambulatory Visit: Payer: Self-pay

## 2020-04-10 ENCOUNTER — Ambulatory Visit (INDEPENDENT_AMBULATORY_CARE_PROVIDER_SITE_OTHER)

## 2020-04-10 ENCOUNTER — Other Ambulatory Visit: Payer: Self-pay | Admitting: Podiatry

## 2020-04-10 DIAGNOSIS — M79605 Pain in left leg: Secondary | ICD-10-CM

## 2020-04-10 DIAGNOSIS — M7989 Other specified soft tissue disorders: Secondary | ICD-10-CM

## 2020-04-10 NOTE — Telephone Encounter (Signed)
Patient called and stated she wanted to request an ultrasound on her leg. She is been having this pain going up her leg up to her thigh for the last three days. Its worse than it was yesterday and the ibuprofen is not working.

## 2020-04-10 NOTE — Telephone Encounter (Signed)
Spoke with patient, she will give them a call. She said thank you

## 2020-04-10 NOTE — Telephone Encounter (Signed)
thanks

## 2020-04-10 NOTE — Telephone Encounter (Signed)
Elnita Maxwell from [facility] Radiology called in to let you know patient has occlusive thrombus, left popliteal vein and calf veins as well (blood clots), they wanted you to be aware of these results immediately, Please Advise

## 2020-04-10 NOTE — Telephone Encounter (Signed)
I called and discussed with her PCP today at 12:15pm. She is going to start her on Eliquis and see her tomorrow. I discussed with the patient and we also discussed side affects of the medication. Faxed results to her PCP. She is going to follow up with her PCP tomorrow.

## 2020-04-10 NOTE — Telephone Encounter (Signed)
I have put the order in for [facility] for the ultrasound. Please have her call them to get in today. Thanks.

## 2020-04-11 ENCOUNTER — Ambulatory Visit (INDEPENDENT_AMBULATORY_CARE_PROVIDER_SITE_OTHER)

## 2020-04-11 ENCOUNTER — Ambulatory Visit (INDEPENDENT_AMBULATORY_CARE_PROVIDER_SITE_OTHER): Admitting: Podiatry

## 2020-04-11 DIAGNOSIS — M2060 Acquired deformities of toe(s), unspecified, unspecified foot: Secondary | ICD-10-CM

## 2020-04-11 DIAGNOSIS — M79605 Pain in left leg: Secondary | ICD-10-CM

## 2020-04-11 DIAGNOSIS — M2062 Acquired deformities of toe(s), unspecified, left foot: Secondary | ICD-10-CM | POA: Diagnosis not present

## 2020-04-11 DIAGNOSIS — M25475 Effusion, left foot: Secondary | ICD-10-CM

## 2020-04-11 NOTE — Progress Notes (Signed)
Subjective: Brittney Marshall is a 45 y.o. is seen today in office s/p Left 1st MTPJ arthrodesis preformed on 03/12/2020. 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 She was started on Eliquis yesterday by her PCP and has follow up with her today.  She still has some swelling but her pain is much improved. She has no other concerns today. Denies any systemic complaints such as fevers, chills, nausea, vomiting. No calf pain, chest pain, shortness of breath.   Objective: General: No acute distress, AAOx3  DP/PT pulses palpable 2/4, CRT < 3 sec to all digits.  Protective sensation intact. Motor function intact.  LEFT foot: Incision is well coapted without any evidence of dehiscence with sutures intact.  There are some mild to moderate edema localized on the first MPJ there is no erythema or warmth there is no fluid collection identified.  There is no swelling to the ankle.  The toe is in rectus position there is no significant discomfort present. No other areas of tenderness to bilateral lower extremities.  No other open lesions or pre-ulcerative lesions.  No pain with calf compression, swelling, warmth, erythema.   Assessment and Plan:  Status post left foot surgery; DVT  -Treatment options discussed including all alternatives, risks, and complications -Repeat x-rays obtained were independent reviewed.  Hardware intact without any complicating factors. -This time I discussed with her starting to transition over the next 2 weeks to partial weightbearing as tolerated in the cam boot.  Still continue ice elevation. -Continue compression -Darco wedge shoe dispensed for home use.  She had this after her other surgery did well with it. -Continue Eliquis per primary care physician. -Monitor for any clinical signs or symptoms of infection and DVT/PE and directed to call the office immediately  should any occur or go to the ER. -Follow-up as scheduled or sooner if any problems arise. In the meantime, encouraged to call the office with any questions, concerns, change in symptoms.   X-ray next appointment left foot  Ovid Curd, DPM

## 2020-04-14 ENCOUNTER — Encounter: Payer: Self-pay | Admitting: Podiatry

## 2020-04-14 ENCOUNTER — Telehealth: Payer: Self-pay | Admitting: Podiatry

## 2020-04-14 NOTE — Telephone Encounter (Signed)
Can you give her an additional 6 weeks?

## 2020-04-14 NOTE — Telephone Encounter (Signed)
Good Morning, Brittney Marshall, Short Term Disability needs to be extended, how much longer would you like me to extend it out?

## 2020-04-24 ENCOUNTER — Telehealth: Payer: Self-pay | Admitting: Podiatry

## 2020-04-24 NOTE — Telephone Encounter (Signed)
Patient has requested surgical shoe with heel at appointment tomorrow in [place], stated the shoe you gave her couple weeks ago was not right but she assumed it was, Please Advise

## 2020-04-24 NOTE — Telephone Encounter (Signed)
error 

## 2020-04-25 ENCOUNTER — Ambulatory Visit (INDEPENDENT_AMBULATORY_CARE_PROVIDER_SITE_OTHER): Admitting: Podiatry

## 2020-04-25 ENCOUNTER — Ambulatory Visit (INDEPENDENT_AMBULATORY_CARE_PROVIDER_SITE_OTHER)

## 2020-04-25 ENCOUNTER — Other Ambulatory Visit: Payer: Self-pay

## 2020-04-25 DIAGNOSIS — M2062 Acquired deformities of toe(s), unspecified, left foot: Secondary | ICD-10-CM

## 2020-04-25 DIAGNOSIS — M7989 Other specified soft tissue disorders: Secondary | ICD-10-CM

## 2020-04-25 DIAGNOSIS — M79605 Pain in left leg: Secondary | ICD-10-CM

## 2020-04-25 DIAGNOSIS — M2060 Acquired deformities of toe(s), unspecified, unspecified foot: Secondary | ICD-10-CM

## 2020-04-29 NOTE — Progress Notes (Signed)
Subjective: Brittney Marshall is a 45 y.o. is seen today in office s/p Left 1st MTPJ arthrodesis preformed on 03/12/2020.  Overall she states that she is doing well.  The swelling is improving as well as the discomfort.  She denies any recent injury or falls or any changes otherwise since I last saw her.  She is on Eliquis due to DVT and tolerating this well.  Denies any systemic complaints such as fevers, chills, nausea, vomiting. No calf pain, chest pain, shortness of breath.   Objective: General: No acute distress, AAOx3  DP/PT pulses palpable 2/4, CRT < 3 sec to all digits.  Protective sensation intact. Motor function intact.  LEFT foot: Incision is well coapted without any evidence of dehiscence and scars forming well.  There is still edema present but appears to be improved there is no area of erythema, warmth.  There is no fluctuation crepitation but there is no fluid collection identified.   No other areas of tenderness to bilateral lower extremities.  No pain with calf compression, swelling, warmth, erythema.   Assessment and Plan:  Status post left foot surgery; DVT  -Treatment options discussed including all alternatives, risks, and complications -Repeat x-rays obtained were independent reviewed.  Hardware intact without any complicating factors.  Radiolucency still evident.  Awaiting radiology report. -At this point discussed that she can transition to partial weight-bear as tolerated.  I continue with ice elevation as well as compression help with swelling.  Continue with cam boot. -Continue Eliquis per primary care physician due to post-op DVT -Monitor for any clinical signs or symptoms of infection and DVT/PE and directed to call the office immediately should any occur or go to the ER. -Follow-up as scheduled or sooner if any problems arise. In the meantime, encouraged to call the office with any questions, concerns, change in symptoms.   *Next appointment repeat x-rays.  If doing  well she can transition to [facility] CAM boot but also will start physical therapy at the [facility] office (please place referral)  X-ray next appointment left foot  Ovid Curd, DPM

## 2020-05-08 ENCOUNTER — Other Ambulatory Visit: Payer: Self-pay

## 2020-05-08 ENCOUNTER — Encounter: Payer: Self-pay | Admitting: Podiatry

## 2020-05-08 ENCOUNTER — Ambulatory Visit (INDEPENDENT_AMBULATORY_CARE_PROVIDER_SITE_OTHER)

## 2020-05-08 ENCOUNTER — Ambulatory Visit (INDEPENDENT_AMBULATORY_CARE_PROVIDER_SITE_OTHER): Admitting: Podiatry

## 2020-05-08 ENCOUNTER — Telehealth: Payer: Self-pay | Admitting: Podiatry

## 2020-05-08 DIAGNOSIS — M2062 Acquired deformities of toe(s), unspecified, left foot: Secondary | ICD-10-CM

## 2020-05-08 DIAGNOSIS — M2060 Acquired deformities of toe(s), unspecified, unspecified foot: Secondary | ICD-10-CM

## 2020-05-08 DIAGNOSIS — M21619 Bunion of unspecified foot: Secondary | ICD-10-CM

## 2020-05-08 NOTE — Progress Notes (Signed)
  Subjective:  Patient ID: Brittney Marshall, female    DOB: Jan 01, 1976,  MRN: [id number]  Chief Complaint  Patient presents with  . Routine Post Op    PT stated that she is doing well she stated that her big toe is a little tender    DOS: 03/12/2020 Procedure: First metatarsophalangeal joint fusion  45 y.o. female returns for post-op check.  Doing fairly well, her swelling and pain is improving  Review of Systems: Negative except as noted in the HPI. Denies N/V/F/Ch.   Objective:  There were no vitals filed for this visit. There is no height or weight on file to calculate BMI. Constitutional Well developed. Well nourished.  Vascular Foot warm and well perfused. Capillary refill normal to all digits.   Neurologic Normal speech. Oriented to person, place, and time. Epicritic sensation to light touch grossly present bilaterally.  Dermatologic  well-healed surgical incisions  Orthopedic:  Minimal tenderness to palpation noted about the surgical site.  Mild edema   Radiographs: Hardware intact and in position but appears to be good bridging across the arthrodesis site Assessment:   1. Acquired deformity of toe, unspecified laterality   2. Bunion    Plan:  Patient was evaluated and treated and all questions answered.  S/p foot surgery left -Progressing as expected post-operatively. -XR: As above -WB Status: Full WBAT in CAM boot -Begin physical therapy at [facility] physical therapy [place] -Compression sleeve applied  Return for follow up with Dr Ardelle Anton as scheduled .

## 2020-05-08 NOTE — Patient Instructions (Signed)
[facility] [address] [place],  [place]  [address]  Main: [phone]

## 2020-05-08 NOTE — Telephone Encounter (Signed)
Patient called and stated that she seen Dr. Lilian Kapur today and she was giving an compression anklet. She wanted to know if she should keep it on at night

## 2020-05-08 NOTE — Telephone Encounter (Signed)
No only when up and moving unless she has lots of swellign

## 2020-05-09 NOTE — Telephone Encounter (Signed)
Spoke to patient,  Patient is requesting a cushion for the boot-she has developed a blister/knot from the strap of the boot

## 2020-05-14 ENCOUNTER — Ambulatory Visit: Admitting: Rehabilitative and Restorative Service Providers"

## 2020-05-19 ENCOUNTER — Encounter: Admitting: Physical Therapy

## 2020-05-20 ENCOUNTER — Other Ambulatory Visit: Payer: Self-pay | Admitting: Podiatry

## 2020-05-20 ENCOUNTER — Telehealth: Payer: Self-pay | Admitting: Podiatry

## 2020-05-20 DIAGNOSIS — M2060 Acquired deformities of toe(s), unspecified, unspecified foot: Secondary | ICD-10-CM

## 2020-05-20 NOTE — Telephone Encounter (Signed)
Order has been placed.

## 2020-05-20 NOTE — Telephone Encounter (Signed)
The following patient called in requesting order for radiology in [facility] for her toe. Stated she would like to go ahead take care of the imaging before she comes to see you on Friday, Please Advise

## 2020-05-21 NOTE — Telephone Encounter (Signed)
Ok thanks, I informed patient

## 2020-05-22 ENCOUNTER — Telehealth: Payer: Self-pay | Admitting: Podiatry

## 2020-05-22 ENCOUNTER — Other Ambulatory Visit: Payer: Self-pay

## 2020-05-22 ENCOUNTER — Ambulatory Visit (INDEPENDENT_AMBULATORY_CARE_PROVIDER_SITE_OTHER)

## 2020-05-22 DIAGNOSIS — S90822A Blister (nonthermal), left foot, initial encounter: Secondary | ICD-10-CM

## 2020-05-22 DIAGNOSIS — M2060 Acquired deformities of toe(s), unspecified, unspecified foot: Secondary | ICD-10-CM | POA: Diagnosis not present

## 2020-05-22 NOTE — Telephone Encounter (Signed)
I called and spoke to the patient to go over her concerns. Has follow-up scheduled tomorrow.

## 2020-05-22 NOTE — Telephone Encounter (Signed)
Pt would like a phone call today about her x-ray. She has developed a blister on the side of the left foot near the 5th toe.

## 2020-05-23 ENCOUNTER — Ambulatory Visit (INDEPENDENT_AMBULATORY_CARE_PROVIDER_SITE_OTHER): Admitting: Podiatry

## 2020-05-23 ENCOUNTER — Encounter: Payer: Self-pay | Admitting: Podiatry

## 2020-05-23 DIAGNOSIS — M79605 Pain in left leg: Secondary | ICD-10-CM

## 2020-05-23 DIAGNOSIS — M21619 Bunion of unspecified foot: Secondary | ICD-10-CM

## 2020-05-23 DIAGNOSIS — M2060 Acquired deformities of toe(s), unspecified, unspecified foot: Secondary | ICD-10-CM

## 2020-05-26 ENCOUNTER — Ambulatory Visit (INDEPENDENT_AMBULATORY_CARE_PROVIDER_SITE_OTHER): Admitting: Rehabilitative and Restorative Service Providers"

## 2020-05-26 ENCOUNTER — Other Ambulatory Visit: Payer: Self-pay

## 2020-05-26 DIAGNOSIS — R29898 Other symptoms and signs involving the musculoskeletal system: Secondary | ICD-10-CM | POA: Diagnosis not present

## 2020-05-26 DIAGNOSIS — M79672 Pain in left foot: Secondary | ICD-10-CM

## 2020-05-26 NOTE — Therapy (Signed)
[facility] [address] [place], [place], [address] Phone: [phone]   Fax:  [phone]  Physical Therapy Evaluation  Patient Details  Name: Brittney Marshall MRN: [id number] Date of Birth: 03/11/1975 Referring Provider (PT): Ovid CurdMatthew Wagoner, DPM   Encounter Date: 05/26/2020   PT End of Session - 05/26/20 1402    Visit Number 1    Number of Visits 12    Date for PT Re-Evaluation 07/07/20    Authorization Type champ va    PT Start Time 0850    PT Stop Time 0930    PT Time Calculation (min) 40 min    Activity Tolerance Patient tolerated treatment well    Behavior [facility] for tasks assessed/performed           Past Medical History:  Diagnosis Date  . Allergic rhinitis   . GERD (gastroesophageal reflux disease)   . Heart murmur    per pt had since child;  had cardiology work-up done 09/ 2020 normal ETT and echo in care everywhere, ef 55-60%,  pt denies symptoms  . History of chest pain (03-11-2020  per pt denies any chest pain/ palpitations/ since   07/ 2020 ED visit @ [facility] (visit in care everywhere) complaint chest pain /palpitations, referred to cardiology;  lov in care everywhere 10-05-2018, [facility], normal w/ no ishcemia ETT 10-03-2018 and echo done same day and EKG 04-06-2018 SR rate 95;  per note pt released prn  . History of TMJ syndrome   . Hypertension    followed by pcp   (pt had ETT 10-03-2018 normal with no ischemia, result in care everywhere)  . Mild intermittent asthma    followed by pulmonology---- dr Judie Petitm. Su Monksejaz---  (03-11-2020 per pt last exacerbation early 2020);  CT chest 06-27-2019 in care everywhre  . Pre-diabetes     Past Surgical History:  Procedure Laterality Date  . ARTHRODESIS METATARSALPHALANGEAL JOINT (MTPJ) Right 12-15-2017  @ [facility]  . DILATION AND CURETTAGE OF UTERUS  x3  last one 2004  . HALLUX FUSION Left 03/12/2020   Procedure: HALLUX FUSION  METATARSALPHALANGEAL JOINT;  Surgeon: Vivi BarrackWagoner, Matthew R, DPM;  [facility] [place] [facility];  Service: Podiatry;  Laterality: Left;  . HALLUX VALGUS LAPIDUS Left 03/12/2020   Procedure: HALLUX VALGUS LAPIDUS INCLUDING BUNIONECTOMY OR;  Surgeon: Vivi BarrackWagoner, Matthew R, DPM;  [facility] [place];  Service: Podiatry;  Laterality: Left;  CHOICE WITH LEG BLOCK  . LAPAROSCOPIC BILATERAL SALPINGECTOMY Bilateral 09-26-2013 @ [facility] [place]   tubal  . ORIF ANKLE FRACTURE Right 2001   per pt hardware was removed    There were no vitals filed for this visit.    Subjective Assessment - 05/26/20 0853    Subjective The patient is s/p left first toe MPT joint arthrodesis on 03/12/20 with complication of DVT.  She has h/o L ankle tendonitis.  The patient feels she is getting cramps in her thighs from shifting weight.  She progressed to FWB last week in cast shoe.  She is walking with cast shoe    Pertinent History R foot bunionectomy in 2019; arthritis    Patient Stated Goals "be able to walk normally", reduce pain in hips    Currently in Pain? Yes    Pain Location Foot    Pain Orientation Left    Pain Descriptors / Indicators Sore    Pain Type Surgical pain    Aggravating Factors  sore  at end of day; some cramping in hips and legs    Pain Relieving Factors elevating    Effect of Pain on Daily Activities sore in hips, getting cramps in her thighs              [facility] PT Assessment - 05/26/20 0854      Assessment   Medical Diagnosis L ankle tendonitis, nontraumatic tear of plantar fascia, deformity of toe, bunion    Referring Provider (PT) Ovid Curd, DPM    Onset Date/Surgical Date 03/12/20      Precautions   Precautions Other (comment)    Precaution Comments cast shoe, using eliquis,      Restrictions   Weight Bearing Restrictions Yes      Balance Screen   Has the patient fallen in the past 6 months No    Has the patient had a decrease in activity level  because of a fear of falling?  No    Is the patient reluctant to leave their home because of a fear of falling?  No      Home [employer]ist information centre manager residence    Living Arrangements Spouse/significant other      Prior Function   Level of Independence Independent    Vocation Full time employment    Vocation Requirements works for [employer]    Leisure return to work 07/21/20      Observation/Other Assessments   Focus on Therapeutic Outcomes (FOTO)  33% functional status survey      ROM / Strength   AROM / PROM / Strength AROM;Strength      AROM   Overall AROM  Within functional limits for tasks performed    AROM Assessment Site Ankle    Right/Left Ankle Left    Left Ankle Dorsiflexion -16    Left Ankle Plantar Flexion 49      Strength   Overall Strength Within functional limits for tasks performed    Overall Strength Comments 5/5 for bilat hip flexion, bilat knee flexion/extension and R ankle DF      Flexibility   Soft Tissue Assessment /Muscle Length yes    Hamstrings tightness bilateral HS, and heel cords      Transfers   Five time sit to stand comments  22 seconds     Ambulation/Gait   Ambulation/Gait Yes    Gait Pattern Lateral trunk lean to right;Antalgic    Gait velocity 1.99 ft/sec                      Objective measurements completed on examination: See above findings.       [facility] Adult PT Treatment/Exercise - 05/26/20 0854      Exercises   Exercises Knee/Hip      Knee/Hip Exercises: Stretches   Active Hamstring Stretch Right;Left;1 rep;30 seconds    Other Knee/Hip Stretches lumbar rotation    Other Knee/Hip Stretches hip adductor stretch      Knee/Hip Exercises: Supine   Bridges AROM;10 reps                  PT Education - 05/26/20 0928    Education Details HEP    Person(s) Educated Patient    Methods Explanation;Demonstration;Handout    Comprehension Verbalized understanding;Returned demonstration                PT Long Term Goals - 05/26/20 1357      PT LONG TERM GOAL #1   Title The patient  will be indep with HEP.    Time 6    Period Weeks    Target Date 07/07/20      PT LONG TERM GOAL #2   Title The patient will improve L ankle DF from -16 degrees neutral to 0 degrees.    Time 6    Period Weeks    Target Date 07/07/20      PT LONG TERM GOAL #3   Title The patient will improve functional status score from 33% to > or equal to 61%.    Time 6    Period Weeks    Target Date 07/07/20      PT LONG TERM GOAL #4   Title The patient will demonstrate normalized gait pattern.    Time 6    Period Weeks    Target Date 07/07/20      PT LONG TERM GOAL #5   Title The patient will improve 5 time sit<>stand to < or equal to 15 seconds.    Baseline 22 seconds    Time 6    Period Weeks    Target Date 07/07/20      Additional Long Term Goals   Additional Long Term Goals Yes      PT LONG TERM GOAL #6   Title The patient will improve gait speed from 1.99 ft/sec to > or equal to 2.6 ft/sec.    Time 6    Period Weeks    Target Date 07/07/20                  Plan - 05/26/20 1359    Clinical Impression Statement The patient is a 45 yo female presenting to OP physical therapy with h/o L MTP arthrodesis on 03/12/20.  She presents with impairments of dec'd L ankle ROM, soreness/pain in L foot, reports of fatigue and cramping in LEs, dec'd flexibility, dec'd gait speed and dec'd mobility per time on 5 time sit to stand.  PT to address deficits to promote return to prior exercise routine and normalize gait for ADLs.    Personal Factors and Comorbidities Comorbidity 2    Comorbidities arthritis, h/o R bunionectomy    Examination-Activity Limitations Stand;Stairs;Squat;Locomotion Level    Examination-Participation Restrictions Cleaning;Meal Prep;Occupation;Community Activity    Stability/Clinical Decision Making Stable/Uncomplicated    Clinical Decision Making Low    Rehab Potential  Good    PT Frequency 2x / week    PT Duration 6 weeks    PT Treatment/Interventions ADLs/Self Care Home Management;Taping;Patient/family education;Dry needling;Manual techniques;Therapeutic activities;Therapeutic exercise;Gait training;Stair training;Functional mobility training    PT Next Visit Plan gentle gastroc stretching, begin weight shifting in standing, work on normalizing gait, hip and LE strengthening, core engagement--- work towards return to gym routine and work activities.    PT Home Exercise Plan [email or web address]    Consulted and Agree with Plan of Care Patient           Patient will benefit from skilled therapeutic intervention in order to improve the following deficits and impairments:  Pain,Impaired flexibility,Decreased range of motion,Increased fascial restricitons,Decreased activity tolerance,Difficulty walking  Visit Diagnosis: Pain in left foot  Other symptoms and signs involving the musculoskeletal system     Problem List Patient Active Problem List   Diagnosis Date Noted  . Esophageal dysmotility 09/20/2019  . Regurgitation and rechewing 09/20/2019  . Laryngospasms 08/24/2019  . Chronic cough 08/01/2019  . Snoring 08/01/2019  . Allergic rhinitis due to animal (cat) (dog) hair and dander 04/19/2019  .  Allergic rhinitis due to pollen 04/19/2019  . Chronic allergic conjunctivitis 04/19/2019  . Gastro-esophageal reflux disease without esophagitis 04/19/2019  . Arthritis 04/13/2019  . Heart murmur 04/13/2019  . Hypertrophy of uterus 09/28/2018  . BMI 40.0-44.9, adult (HCC) 11/14/2017  . Pes planus 10/18/2016  . Prediabetes 09/09/2016  . Encounter for routine gynecological examination 11/12/2014  . Family history of breast cancer 11/12/2014  . Obesity, Class II, BMI 35-39.9, with comorbidity 11/12/2014  . Uncomplicated asthma 10/24/2014  . History of female sterilization 08/05/2013  . Hypertension 10/31/2012    Fayetta Sorenson, PT 05/26/2020, 2:03 PM  [facility] [address] [place], [place], [address] Phone: [phone]   Fax:  [phone]  Name: Brittney Marshall MRN: [id number] Date of Birth: Jul 27, 1975

## 2020-05-26 NOTE — Patient Instructions (Signed)
Access Code: [email or web address] URL: [email or web address] Date: 05/26/2020 Prepared by: Margretta Ditty  Exercises Supine Lower Trunk Rotation - 2 x daily - 7 x weekly - 1 sets - 10 reps Supine Hip Adductor Stretch - 2 x daily - 7 x weekly - 1 sets - 3-5 reps - 30 seconds hold Supine Bridge - 2 x daily - 7 x weekly - 1 sets - 10 reps Seated Hamstring Stretch with Chair - 2 x daily - 7 x weekly - 1 sets - 2-3 reps - 20-30 seconds hold

## 2020-05-28 ENCOUNTER — Encounter: Payer: Self-pay | Admitting: Podiatry

## 2020-05-28 ENCOUNTER — Telehealth: Payer: Self-pay | Admitting: Podiatry

## 2020-05-28 NOTE — Progress Notes (Signed)
Subjective: Brittney Marshall is a 45 y.o. is seen today in office s/p Left 1st MTPJ arthrodesis preformed on 03/12/2020.  States that she has been doing better.  She been walking the cam boot and she has been wearing the compression wrap.  I talked to her yesterday and she said she developed a blister on the side of her foot causing discomfort.  She feels the swelling has improved and she denies recent injury or falls or any changes.  She is on Eliquis due to DVT without any side effects.  This is being managed by her primary care physician. Denies any systemic complaints such as fevers, chills, nausea, vomiting. No calf pain, chest pain, shortness of breath.   Objective: General: No acute distress, AAOx3  DP/PT pulses palpable 2/4, CRT < 3 sec to all digits.  Protective sensation intact. Motor function intact.  LEFT foot: Incision is well coapted without any evidence of dehiscence and scar is well formed.  There is no blister present on the left foot but she points on the area of the fifth metatarsal head where she has noticed this "blister" feeling.  Arthrodesis site appears to be stable is decreased edema.  No erythema or warmth.  Minimal tenderness palpation.  The hardware is not palpable. No other areas of tenderness to bilateral lower extremities.  No pain with calf compression, swelling, warmth, erythema.   Assessment and Plan:  Status post left foot surgery; DVT  -Treatment options discussed including all alternatives, risks, and complications -Repeat x-rays obtained were independent reviewed.  Hardware intact.  Does not read as having osseous bridging yet but clinically she appears to be doing well and the arthrodesis site appears to be stable.  Discussed with her she can start to transition to a surgical shoe as tolerated which was dispensed today.  Continue with your compression wrap of the Ace bandage to help with swelling.  I would continue ice elevation. -We will start physical therapy.   I like for them to start work on general rehab in her legs and hold off on significant therapy to the surgical site until I see her back next appointment. -At this point she is not yet able to return to work.  I will extend her out of work until June 20.  [name]

## 2020-05-28 NOTE — Telephone Encounter (Signed)
Brittney Marshall- her note is complete if you can extend her out of work until June 20th. Thanks.

## 2020-05-28 NOTE — Telephone Encounter (Signed)
Patient called and she stated she seen Dr. Ardelle Anton last Friday, she needs documentation from her last visit. She need her short term disability filled out and she needs a note for work stating that she needs to continue to stay out of work due to PT and healing of her foot. She is being released to go back on 07/21/20.

## 2020-05-30 ENCOUNTER — Encounter: Admitting: Physical Therapy

## 2020-06-03 ENCOUNTER — Encounter: Payer: Self-pay | Admitting: Rehabilitative and Restorative Service Providers"

## 2020-06-03 ENCOUNTER — Other Ambulatory Visit: Payer: Self-pay

## 2020-06-03 ENCOUNTER — Ambulatory Visit (INDEPENDENT_AMBULATORY_CARE_PROVIDER_SITE_OTHER): Admitting: Rehabilitative and Restorative Service Providers"

## 2020-06-03 DIAGNOSIS — R29898 Other symptoms and signs involving the musculoskeletal system: Secondary | ICD-10-CM

## 2020-06-03 DIAGNOSIS — M79672 Pain in left foot: Secondary | ICD-10-CM | POA: Diagnosis not present

## 2020-06-03 NOTE — Therapy (Signed)
[facility] [place] [address] [place], [place], [address] Phone: [phone]   Fax:  [phone]  Physical Therapy Treatment  Patient Details  Name: Brittney Marshall MRN: [id number] Date of Birth: 1975/07/20 Referring Provider (PT): Ovid Curd, DPM   Encounter Date: 06/03/2020   PT End of Session - 06/03/20 0940    Visit Number 2    Number of Visits 12    Date for PT Re-Evaluation 07/07/20    Authorization Type champ va    PT Start Time 0935    PT Stop Time 1015    PT Time Calculation (min) 40 min    Activity Tolerance Patient tolerated treatment well    Behavior During Therapy [facility] for tasks assessed/performed           Past Medical History:  Diagnosis Date  . Allergic rhinitis   . GERD (gastroesophageal reflux disease)   . Heart murmur    per pt had since child;  had cardiology work-up done 09/ 2020 normal ETT and echo in care everywhere, ef 55-60%,  pt denies symptoms  . History of chest pain (03-11-2020  per pt denies any chest pain/ palpitations/ since   07/ 2020 ED visit @ [facility] (visit in care everywhere) complaint chest pain /palpitations, referred to cardiology;  lov in care everywhere 10-05-2018, K. Clovis Riley NP [facility], normal w/ no ishcemia ETT 10-03-2018 and echo done same day and EKG 04-06-2018 SR rate 95;  per note pt released prn  . History of TMJ syndrome   . Hypertension    followed by pcp   (pt had ETT 10-03-2018 normal with no ischemia, result in care everywhere)  . Mild intermittent asthma    followed by pulmonology---- dr Judie Petit. Su Monks---  (03-11-2020 per pt last exacerbation early 2020);  CT chest 06-27-2019 in care everywhre  . Pre-diabetes     Past Surgical History:  Procedure Laterality Date  . ARTHRODESIS METATARSALPHALANGEAL JOINT (MTPJ) Right 12-15-2017  @ [facility]  . DILATION AND CURETTAGE OF UTERUS  x3  last one 2004  . HALLUX FUSION Left 03/12/2020   Procedure: HALLUX FUSION  METATARSALPHALANGEAL JOINT;  Surgeon: Vivi Barrack, DPM;  [facility];  Service: Podiatry;  Laterality: Left;  . HALLUX VALGUS LAPIDUS Left 03/12/2020   Procedure: HALLUX VALGUS LAPIDUS INCLUDING BUNIONECTOMY OR;  Surgeon: Vivi Barrack, DPM;  Location: [facility] ;  Service: Podiatry;  Laterality: Left;  CHOICE WITH LEG BLOCK  . LAPAROSCOPIC BILATERAL SALPINGECTOMY Bilateral 09-26-2013 @ [facility] in [place]   tubal  . ORIF ANKLE FRACTURE Right 2001   per pt hardware was removed    There were no vitals filed for this visit.   Subjective Assessment - 06/03/20 0936    Subjective The patient reports she is moving better with exercises and has started walking 30 minutes/day with her husband.  The hip pain has gotten a lot better.  She is feeling muscle soreness in the back of the leg all the way down into her calves.    Pertinent History R foot bunionectomy in 2019; arthritis    Patient Stated Goals "be able to walk normally", reduce pain in hips    Currently in Pain? Yes    Pain Score 8    in soreness in calf muscle and foot.   Pain Location Foot    Pain Orientation Left    Pain Descriptors / Indicators Sore    Pain Type Surgical pain  Pain Onset More than a month ago    Aggravating Factors  sore at end of day    Pain Relieving Factors elevating              [facility] PT Assessment - 06/03/20 0945      Assessment   Medical Diagnosis L ankle tendonitis, nontraumatic tear of plantar fascia, deformity of toe, bunion    Referring Provider (PT) Ovid Curd, DPM    Onset Date/Surgical Date 03/12/20                         [facility] Adult PT Treatment/Exercise - 06/03/20 0945      Ambulation/Gait   Ambulation/Gait Yes    Ambulation/Gait Assistance 7: Independent    Ambulation Distance (Feet) 300 Feet    Stairs Yes    Stairs Assistance 6: Modified independent (Device/Increase time)    Stair Management Technique  Alternating pattern;Two rails    Pre-Gait Activities worked on reciprocal pattern on stairs    Gait Comments Patient demonstrating more normalized gait pattern today.  *She is walking at home with her cast shoe on.  PT recommended she not walk great distances with cast shoe due to dec'd support.      Self-Care   Self-Care Other Self-Care Comments    Other Self-Care Comments  discussed trying to wear supportive sneaker for walking versus cast shoe.  Due to swelling recommended she not leave on all day, but try to slowly increase tolerance to wear.      Neuro Re-ed    Neuro Re-ed Details  rocker board and compliant surface standing      Exercises   Exercises Knee/Hip      Knee/Hip Exercises: Stretches   Active Hamstring Stretch Right;Left;1 rep;30 seconds    Gastroc Stretch Right;Left;30 seconds;3 reps      Knee/Hip Exercises: Standing   Forward Step Up Left;10 reps    Step Down Left;Right;5 reps    Step Down Limitations on compliant surface    Gait Training --      Manual Therapy   Manual Therapy Soft tissue mobilization    Manual therapy comments L foot STM to plantar surface avoiding first MT/ MTP    Soft tissue mobilization STM L foot                  PT Education - 06/03/20 2134    Education Details HEP    Person(s) Educated Patient    Methods Explanation;Demonstration;Handout    Comprehension Verbalized understanding;Returned demonstration               PT Long Term Goals - 05/26/20 1357      PT LONG TERM GOAL #1   Title The patient will be indep with HEP.    Time 6    Period Weeks    Target Date 07/07/20      PT LONG TERM GOAL #2   Title The patient will improve L ankle DF from -16 degrees neutral to 0 degrees.    Time 6    Period Weeks    Target Date 07/07/20      PT LONG TERM GOAL #3   Title The patient will improve functional status score from 33% to > or equal to 61%.    Time 6    Period Weeks    Target Date 07/07/20      PT LONG TERM  GOAL #4   Title The patient will demonstrate normalized  gait pattern.    Time 6    Period Weeks    Target Date 07/07/20      PT LONG TERM GOAL #5   Title The patient will improve 5 time sit<>stand to < or equal to 15 seconds.    Baseline 22 seconds    Time 6    Period Weeks    Target Date 07/07/20      Additional Long Term Goals   Additional Long Term Goals Yes      PT LONG TERM GOAL #6   Title The patient will improve gait speed from 1.99 ft/sec to > or equal to 2.6 ft/sec.    Time 6    Period Weeks    Target Date 07/07/20                 Plan - 06/03/20 1019    Clinical Impression Statement The patient has made great progress since evaluation tolerating increased activity.  She increased her walking significantly since eval and notes some soreness in plantar surface of foot.  PT discussed to limit distances until she can tolerate a more supportive shoe so cast shoe does not lead to further issues.  PT and patient discussed short duration wearing schedule for tennis shoes due to intermittent edema.  Patient plans to discuss with MD Friday.  Continue progressing to tolerance.    PT Treatment/Interventions ADLs/Self Care Home Management;Taping;Patient/family education;Dry needling;Manual techniques;Therapeutic activities;Therapeutic exercise;Gait training;Stair training;Functional mobility training    PT Next Visit Plan Progress LE strengthening and functional mobility, core strength, and work towards return to gym and work activities.    PT Home Exercise Plan [email or web address]    Consulted and Agree with Plan of Care Patient           Patient will benefit from skilled therapeutic intervention in order to improve the following deficits and impairments:     Visit Diagnosis: Pain in left foot  Other symptoms and signs involving the musculoskeletal system     Problem List Patient Active Problem List   Diagnosis Date Noted  . Esophageal dysmotility 09/20/2019  .  Regurgitation and rechewing 09/20/2019  . Laryngospasms 08/24/2019  . Chronic cough 08/01/2019  . Snoring 08/01/2019  . Allergic rhinitis due to animal (cat) (dog) hair and dander 04/19/2019  . Allergic rhinitis due to pollen 04/19/2019  . Chronic allergic conjunctivitis 04/19/2019  . Gastro-esophageal reflux disease without esophagitis 04/19/2019  . Arthritis 04/13/2019  . Heart murmur 04/13/2019  . Hypertrophy of uterus 09/28/2018  . BMI 40.0-44.9, adult (HCC) 11/14/2017  . Pes planus 10/18/2016  . Prediabetes 09/09/2016  . Encounter for routine gynecological examination 11/12/2014  . Family history of breast cancer 11/12/2014  . Obesity, Class II, BMI 35-39.9, with comorbidity 11/12/2014  . Uncomplicated asthma 10/24/2014  . History of female sterilization 08/05/2013  . Hypertension 10/31/2012    Niley Helbig, PT 06/03/2020, 9:49 PM  [facility] [address] [place], [place], [address] Phone: [phone]   Fax:  [phone]  Name: SAVAYA HAKES MRN: [id number] Date of Birth: April 27, 1975

## 2020-06-03 NOTE — Patient Instructions (Signed)
Access Code: [email or web address] URL: [email or web address] Date: 06/03/2020 Prepared by: Margretta Ditty  Exercises Supine Lower Trunk Rotation - 2 x daily - 7 x weekly - 1 sets - 10 reps Supine Hip Adductor Stretch - 2 x daily - 7 x weekly - 1 sets - 3-5 reps - 30 seconds hold Supine Bridge - 2 x daily - 7 x weekly - 1 sets - 10 reps Seated Hamstring Stretch with Chair - 2 x daily - 7 x weekly - 1 sets - 2-3 reps - 20-30 seconds hold Gastroc Stretch on Wall - 2 x daily - 7 x weekly - 1 sets - 3 reps - 30 seconds hold Step Up - 2 x daily - 7 x weekly - 1 sets - 10 reps

## 2020-06-06 ENCOUNTER — Ambulatory Visit (INDEPENDENT_AMBULATORY_CARE_PROVIDER_SITE_OTHER): Admitting: Podiatry

## 2020-06-06 ENCOUNTER — Other Ambulatory Visit: Payer: Self-pay

## 2020-06-06 ENCOUNTER — Ambulatory Visit (INDEPENDENT_AMBULATORY_CARE_PROVIDER_SITE_OTHER)

## 2020-06-06 ENCOUNTER — Encounter: Admitting: Physical Therapy

## 2020-06-06 ENCOUNTER — Encounter: Payer: Self-pay | Admitting: Podiatry

## 2020-06-06 DIAGNOSIS — M960 Pseudarthrosis after fusion or arthrodesis: Secondary | ICD-10-CM

## 2020-06-06 DIAGNOSIS — M2060 Acquired deformities of toe(s), unspecified, unspecified foot: Secondary | ICD-10-CM

## 2020-06-06 DIAGNOSIS — E559 Vitamin D deficiency, unspecified: Secondary | ICD-10-CM

## 2020-06-06 DIAGNOSIS — M79672 Pain in left foot: Secondary | ICD-10-CM

## 2020-06-07 LAB — VITAMIN D 25 HYDROXY (VIT D DEFICIENCY, FRACTURES): Vit D, 25-Hydroxy: 33 ng/mL (ref 30–100)

## 2020-06-09 ENCOUNTER — Telehealth: Payer: Self-pay | Admitting: Podiatry

## 2020-06-09 ENCOUNTER — Encounter: Payer: Self-pay | Admitting: Podiatry

## 2020-06-09 NOTE — Telephone Encounter (Signed)
I called and spoke with the patient. Went over results. Brittney Marshall from [employer] contacted the patient today about the bone stimulator. Answered all of her questions.

## 2020-06-09 NOTE — Telephone Encounter (Signed)
Pt called and stated that she wanted to speak to Dr. Ardelle Anton concerning her surgical foot. She would like a call back

## 2020-06-09 NOTE — Telephone Encounter (Signed)
She also wanted to follow up on the bone stimulator as well.

## 2020-06-09 NOTE — Progress Notes (Signed)
Subjective: Brittney Marshall is a 45 y.o. is seen today in office s/p Left 1st MTPJ arthrodesis preformed on 03/12/2020.  She has been doing physical therapy and she feels the swelling has been improving.  She still walking in a surgical shoe.  Pain is also improved and gets slight discomfort at times intermittently.  Denies any recent injury or falls no other changes.  She is currently taking over-the-counter dose of vitamin D asking for the level be rechecked. Denies any systemic complaints such as fevers, chills, nausea, vomiting. No calf pain, chest pain, shortness of breath.   Objective: General: No acute distress, AAOx3  DP/PT pulses palpable 2/4, CRT < 3 sec to all digits.  Protective sensation intact. Motor function intact.  LEFT foot: Incision is well coapted without any evidence of dehiscence and scar is well formed.  There is no open lesions or blister formation.  There is mild edema but appears to be improved.  There is no erythema or warmth.  No significant discomfort to palpation on surgical site.  Arthrodesis site appears to be stable. No other areas of tenderness to bilateral lower extremities.  No pain with calf compression, swelling, warmth, erythema.   Assessment and Plan:  Status post left foot surgery, nonunion arthrodesis site; DVT  -Treatment options discussed including all alternatives, risks, and complications -Repeat x-rays obtained were independent reviewed.  Hardware intact.  There is still not osseous bridging present across the arthrodesis site. -At this point today she is going into a nonunion.  We will recheck a vitamin D level to make sure this is come up.  Also will order a bone stimulator.  Encouraged to continue with surgical shoe. -She can continue physical therapy following her work more manual therapy and improved the leg, ankle range of motion and strength but for now remain in surgical shoe. -Ice elevation, compression  Return in about 3 weeks (around  06/27/2020).  [name]

## 2020-06-13 ENCOUNTER — Encounter: Payer: Self-pay | Admitting: Physical Therapy

## 2020-06-13 ENCOUNTER — Ambulatory Visit (INDEPENDENT_AMBULATORY_CARE_PROVIDER_SITE_OTHER): Admitting: Physical Therapy

## 2020-06-13 ENCOUNTER — Telehealth: Payer: Self-pay | Admitting: Podiatry

## 2020-06-13 ENCOUNTER — Other Ambulatory Visit: Payer: Self-pay

## 2020-06-13 DIAGNOSIS — M79672 Pain in left foot: Secondary | ICD-10-CM | POA: Diagnosis not present

## 2020-06-13 DIAGNOSIS — R29898 Other symptoms and signs involving the musculoskeletal system: Secondary | ICD-10-CM

## 2020-06-13 NOTE — Telephone Encounter (Signed)
I called and spoke to the patient on how to apply the bone stimulator and were to apply it

## 2020-06-13 NOTE — Telephone Encounter (Signed)
Patient calling to request a call back. Bone stimulator came in today and patient is uncertain where to put it on the foot.

## 2020-06-13 NOTE — Therapy (Signed)
[facility] [place], [place], [address] Phone: [phone]   Fax:  [phone]  Physical Therapy Treatment  Patient Details  Name: Brittney Marshall MRN: [id number] Date of Birth: Feb 08, 1975 Referring Provider (PT): Celesta Gentile, DPM   Encounter Date: 06/13/2020   PT End of Session - 06/13/20 1100    Visit Number 3    Number of Visits 12    Date for PT Re-Evaluation 07/07/20    Authorization Type champ va    PT Start Time 1019    PT Stop Time 1058    PT Time Calculation (min) 39 min    Activity Tolerance Patient tolerated treatment well    Behavior During Therapy [facility] for tasks assessed/performed           Past Medical History:  Diagnosis Date  . Allergic rhinitis   . GERD (gastroesophageal reflux disease)   . Heart murmur    per pt had since child;  had cardiology work-up done 09/ 2020 normal ETT and echo in care everywhere, ef 55-60%,  pt denies symptoms  . History of chest pain (03-11-2020  per pt denies any chest pain/ palpitations/ since   07/ 2020 ED visit @ [place] (visit in care everywhere) complaint chest pain /palpitations, referred to cardiology;  lov in care everywhere 10-05-2018, K. Alroy Dust NP [facility], normal w/ no ishcemia ETT 10-03-2018 and echo done same day and EKG 04-06-2018 SR rate 95;  per note pt released prn  . History of TMJ syndrome   . Hypertension    followed by pcp   (pt had ETT 10-03-2018 normal with no ischemia, result in care everywhere)  . Mild intermittent asthma    followed by pulmonology---- dr Jerilynn Mages. Camillo Flaming---  (03-11-2020 per pt last exacerbation early 2020);  CT chest 06-27-2019 in care everywhre  . Pre-diabetes     Past Surgical History:  Procedure Laterality Date  . ARTHRODESIS METATARSALPHALANGEAL JOINT (MTPJ) Right 12-15-2017  @ [facility]  . DILATION AND CURETTAGE OF UTERUS  x3  last one 2004  . HALLUX FUSION Left 03/12/2020   Procedure: [place] FUSION  METATARSALPHALANGEAL JOINT;  Surgeon: Trula Slade, DPM;  Location: [place];  Service: Podiatry;  Laterality: Left;  . HALLUX VALGUS LAPIDUS Left 03/12/2020   Procedure: HALLUX VALGUS LAPIDUS INCLUDING BUNIONECTOMY OR;  Surgeon: Trula Slade, DPM;  Location: [place];  Service: Podiatry;  Laterality: Left;  CHOICE WITH LEG BLOCK  . LAPAROSCOPIC BILATERAL SALPINGECTOMY Bilateral 09-26-2013 @ [facility] in [place]   tubal  . ORIF ANKLE FRACTURE Right 2001   per pt hardware was removed    There were no vitals filed for this visit.   Subjective Assessment - 06/13/20 1021    Subjective Pt said she did fine with her regular shoe, but Dr says she needs keep her cast shoe on for ambulation.  She will be getting bone stimulator today.    Pertinent History R foot bunionectomy in 2019; arthritis    Patient Stated Goals "be able to walk normally", reduce pain in hips    Currently in Pain? Yes    Pain Score 5     Pain Location Toe (Comment which one)    Pain Orientation Left    Pain Descriptors / Indicators Sore              [facility] PT Assessment - 06/13/20 0001      Assessment   Medical Diagnosis L  ankle tendonitis, nontraumatic tear of plantar fascia, deformity of toe, bunion    Referring Provider (PT) Celesta Gentile, DPM    Onset Date/Surgical Date 03/12/20      Transfers   Five time sit to stand comments  13.6            [facility] Adult PT Treatment/Exercise - 06/13/20 0001      Knee/Hip Exercises: Stretches   Passive Hamstring Stretch Right;Left;2 reps;20 seconds    Quad Stretch Left;Right;2 reps;20 seconds    Gastroc Stretch Right;Left;20 seconds;2 reps    Other Knee/Hip Stretches supine butterfly stretch x 20 sec x 2    Other Knee/Hip Stretches LTR x 3 reps of 10 sec      Knee/Hip Exercises: Aerobic   Nustep L5: 6 min (legs only)      Knee/Hip Exercises: Standing   Lateral Step Up Left;1 set;10 reps;Hand Hold: 1;Step Height: 6"     Forward Step Up Left;1 set;10 reps;Step Height: 6"      Knee/Hip Exercises: Seated   Sit to Sand 15 reps;without UE support   5 fast, 10 slow, cues for form.     Knee/Hip Exercises: Supine   Bridges 10 reps    Bridges Limitations 5 sec held in ext                       PT Long Term Goals - 06/13/20 1036      PT LONG TERM GOAL #1   Title The patient will be indep with HEP.    Time 6    Period Weeks    Status On-going      PT LONG TERM GOAL #2   Title The patient will improve L ankle DF from -16 degrees neutral to 0 degrees.    Time 6    Period Weeks    Status On-going      PT LONG TERM GOAL #3   Title The patient will improve functional status score from 33% to > or equal to 61%.    Time 6    Period Weeks    Status On-going      PT LONG TERM GOAL #4   Title The patient will demonstrate normalized gait pattern.    Time 6    Period Weeks    Status On-going      PT LONG TERM GOAL #5   Title The patient will improve 5 time sit<>stand to < or equal to 15 seconds.    Baseline 13.6 seconds    Time 6    Period Weeks    Status Achieved      PT LONG TERM GOAL #6   Title The patient will improve gait speed from 1.99 ft/sec to > or equal to 2.6 ft/sec.    Time 6    Period Weeks    Status On-going                 Plan - 06/13/20 1046    Clinical Impression Statement Pt able to completed 5x sit to stand in 13.6 sec; has met LTG#5.  Pt required some cues for form on sit to stand to avoid back irritation.  She tolerated all exercises without increase in pain, reported reduction of pain at end of session.    Rehab Potential Good    PT Frequency 2x / week    PT Duration 6 weeks    PT Treatment/Interventions ADLs/Self Care Home Management;Taping;Patient/family education;Dry needling;Manual techniques;Therapeutic activities;Therapeutic exercise;Gait training;Stair  training;Functional mobility training    PT Next Visit Plan Progress LE strengthening and  functional mobility, core strength, and work towards return to gym and work activities.  Ck DF ROM.    PT Home Exercise Plan [email or web address]    Consulted and Agree with Plan of Care Patient           Patient will benefit from skilled therapeutic intervention in order to improve the following deficits and impairments:  Pain,Impaired flexibility,Decreased range of motion,Increased fascial restricitons,Decreased activity tolerance,Difficulty walking  Visit Diagnosis: Pain in left foot  Other symptoms and signs involving the musculoskeletal system     Problem List Patient Active Problem List   Diagnosis Date Noted  . Esophageal dysmotility 09/20/2019  . Regurgitation and rechewing 09/20/2019  . Laryngospasms 08/24/2019  . Chronic cough 08/01/2019  . Snoring 08/01/2019  . Allergic rhinitis due to animal (cat) (dog) hair and dander 04/19/2019  . Allergic rhinitis due to pollen 04/19/2019  . Chronic allergic conjunctivitis 04/19/2019  . Gastro-esophageal reflux disease without esophagitis 04/19/2019  . Arthritis 04/13/2019  . Heart murmur 04/13/2019  . Hypertrophy of uterus 09/28/2018  . BMI 40.0-44.9, adult ([facility]) 11/14/2017  . Pes planus 10/18/2016  . Prediabetes 09/09/2016  . Encounter for routine gynecological examination 11/12/2014  . Family history of breast cancer 11/12/2014  . Obesity, Class II, BMI 35-39.9, with comorbidity 11/12/2014  . Uncomplicated asthma 43/53/9122  . History of female sterilization 08/05/2013  . Hypertension 10/31/2012   Kerin Perna, PTA 06/13/20 11:47 AM  [facility] [address] [place], [place], [address] Phone: [phone]   Fax:  [phone]  Name: Brittney Marshall MRN: [id number] Date of Birth: 07-19-1975

## 2020-06-18 ENCOUNTER — Other Ambulatory Visit: Payer: Self-pay

## 2020-06-18 ENCOUNTER — Ambulatory Visit (INDEPENDENT_AMBULATORY_CARE_PROVIDER_SITE_OTHER): Admitting: Rehabilitative and Restorative Service Providers"

## 2020-06-18 DIAGNOSIS — M79672 Pain in left foot: Secondary | ICD-10-CM | POA: Diagnosis not present

## 2020-06-18 DIAGNOSIS — R29898 Other symptoms and signs involving the musculoskeletal system: Secondary | ICD-10-CM

## 2020-06-18 NOTE — Patient Instructions (Signed)
Access Code: [email or web address] URL: [email or web address] Date: 06/18/2020 Prepared by: Margretta Ditty  Exercises Supine Lower Trunk Rotation - 2 x daily - 7 x weekly - 1 sets - 10 reps Marching Bridge - 2 x daily - 7 x weekly - 1 sets - 10 reps Sidelying Hip Abduction - 2 x daily - 7 x weekly - 1 sets - 10 reps Prone Quadriceps Stretch with Strap - 1 x daily - 7 x weekly - 3 sets - 2-3 reps - 20-30 seconds hold Seated Hamstring Stretch with Chair - 2 x daily - 7 x weekly - 1 sets - 2-3 reps - 20-30 seconds hold Side Lunge Adductor Stretch - 2 x daily - 7 x weekly - 1 sets - 3 reps - 20 seconds hold Gastroc Stretch on Wall - 2 x daily - 7 x weekly - 1 sets - 3 reps - 30 seconds hold Step Up - 2 x daily - 7 x weekly - 1 sets - 10 reps

## 2020-06-18 NOTE — Therapy (Signed)
[facility] [place] [address] [place], [place], [address] Phone: [phone]   Fax:  [phone]  Physical Therapy Treatment  Patient Details  Name: Brittney Marshall MRN: [id number] Date of Birth: 22-Feb-1975 Referring Provider (PT): Ovid Curd, DPM   Encounter Date: 06/18/2020   PT End of Session - 06/18/20 1014    Visit Number 4    Number of Visits 12    Date for PT Re-Evaluation 07/07/20    Authorization Type champ va    PT Start Time 0930    PT Stop Time 1016    PT Time Calculation (min) 46 min    Activity Tolerance Patient tolerated treatment well    Behavior During Therapy [facility] for tasks assessed/performed           Past Medical History:  Diagnosis Date  . Allergic rhinitis   . GERD (gastroesophageal reflux disease)   . Heart murmur    per pt had since child;  had cardiology work-up done 09/ 2020 normal ETT and echo in care everywhere, ef 55-60%,  pt denies symptoms  . History of chest pain (03-11-2020  per pt denies any chest pain/ palpitations/ since   07/ 2020 ED visit @ [facility] (visit in care everywhere) complaint chest pain /palpitations, referred to cardiology;  lov in care everywhere 10-05-2018, K. Clovis Riley NP [facility], normal w/ no ishcemia ETT 10-03-2018 and echo done same day and EKG 04-06-2018 SR rate 95;  per note pt released prn  . History of TMJ syndrome   . Hypertension    followed by pcp   (pt had ETT 10-03-2018 normal with no ischemia, result in care everywhere)  . Mild intermittent asthma    followed by pulmonology---- dr Judie Petit. Su Monks---  (03-11-2020 per pt last exacerbation early 2020);  CT chest 06-27-2019 in care everywhre  . Pre-diabetes     Past Surgical History:  Procedure Laterality Date  . ARTHRODESIS METATARSALPHALANGEAL JOINT (MTPJ) Right 12-15-2017  @ [facility]  . DILATION AND CURETTAGE OF UTERUS  x3  last one 2004  . HALLUX FUSION Left 03/12/2020   Procedure: HALLUX FUSION  METATARSALPHALANGEAL JOINT;  Surgeon: Vivi Barrack, DPM;  Location: [facility];  Service: Podiatry;  Laterality: Left;  . HALLUX VALGUS LAPIDUS Left 03/12/2020   Procedure: HALLUX VALGUS LAPIDUS INCLUDING BUNIONECTOMY OR;  Surgeon: Vivi Barrack, DPM;  Location: [facility] [place];  Service: Podiatry;  Laterality: Left;  CHOICE WITH LEG BLOCK  . LAPAROSCOPIC BILATERAL SALPINGECTOMY Bilateral 09-26-2013 @ [facility] in [place]   tubal  . ORIF ANKLE FRACTURE Right 2001   per pt hardware was removed    There were no vitals filed for this visit.   Subjective Assessment - 06/18/20 0934    Subjective The patient reports she got the bone stimulator and had error message-- she has called customer service to work through message.  The color of her foot is returning to normal.    Pertinent History R foot bunionectomy in 2019; arthritis    Patient Stated Goals "be able to walk normally", reduce pain in hips    Currently in Pain? No/denies              [facility] PT Assessment - 06/18/20 0941      Assessment   Medical Diagnosis L ankle tendonitis, nontraumatic tear of plantar fascia, deformity of toe, bunion    Referring Provider (PT) Ovid Curd, DPM    Onset Date/Surgical Date 03/12/20  AROM   Left Ankle Dorsiflexion 10                         [facility] Adult PT Treatment/Exercise - 06/18/20 0943      Self-Care   Self-Care Other Self-Care Comments    Other Self-Care Comments  STM using a muscle roller to improve overall LE mobility bilaterally      Exercises   Exercises Knee/Hip;Ankle      Knee/Hip Exercises: Stretches   Other Knee/Hip Stretches lumbar rotation    Other Knee/Hip Stretches standing hip adductor stretch      Knee/Hip Exercises: Standing   Lateral Step Up Right;Left;10 reps      Knee/Hip Exercises: Supine   Bridges 10 reps    Other Supine Knee/Hip Exercises bridges with marching x 10 reps      Knee/Hip Exercises:  Sidelying   Hip ABduction Strengthening;Left;10 reps      Manual Therapy   Manual Therapy Soft tissue mobilization    Manual therapy comments to reduce muscle shortening    Soft tissue mobilization STM L gastroc, L soleous, and plantar aspect of foot; also used IASTM.  Performed scar tissue mobilization.      Ankle Exercises: Stretches   Slant Board Stretch 1 rep;60 seconds                  PT Education - 06/18/20 1226    Education Details HEP progression    Person(s) Educated Patient    Methods Explanation;Demonstration;Handout    Comprehension Verbalized understanding;Returned demonstration               PT Long Term Goals - 06/18/20 0942      PT LONG TERM GOAL #1   Title The patient will be indep with HEP.    Time 6    Period Weeks    Status On-going      PT LONG TERM GOAL #2   Title The patient will improve L ankle DF from -16 degrees neutral to 0 degrees.    Baseline 10 degrees L ankle DF.    Time 6    Period Weeks    Status Achieved      PT LONG TERM GOAL #3   Title The patient will improve functional status score from 33% to > or equal to 61%.    Time 6    Period Weeks    Status On-going      PT LONG TERM GOAL #4   Title The patient will demonstrate normalized gait pattern.    Time 6    Period Weeks    Status On-going      PT LONG TERM GOAL #5   Title The patient will improve 5 time sit<>stand to < or equal to 15 seconds.    Baseline 13.6 seconds    Time 6    Period Weeks    Status Achieved      PT LONG TERM GOAL #6   Title The patient will improve gait speed from 1.99 ft/sec to > or equal to 2.6 ft/sec.    Time 6    Period Weeks    Status On-going                 Plan - 06/18/20 1230    Clinical Impression Statement The patient is using bone stimulator at home and progression of standing ther ex somewhat limited due to cast shoe.  PT increasing difficulty of HEP to  patient tolerance. The patient has significant Soft tissue  tightness and responded well to STM.  Continue working to [employer].    PT Treatment/Interventions ADLs/Self Care Home Management;Taping;Patient/family education;Dry needling;Manual techniques;Therapeutic activities;Therapeutic exercise;Gait training;Stair training;Functional mobility training    PT Next Visit Plan Progress LE strengthening and functional mobility, core strength, and work towards return to gym and work activities.  Ck DF ROM.    PT Home Exercise Plan [email or web address]    Consulted and Agree with Plan of Care Patient           Patient will benefit from skilled therapeutic intervention in order to improve the following deficits and impairments:     Visit Diagnosis: Pain in left foot  Other symptoms and signs involving the musculoskeletal system     Problem List Patient Active Problem List   Diagnosis Date Noted  . Esophageal dysmotility 09/20/2019  . Regurgitation and rechewing 09/20/2019  . Laryngospasms 08/24/2019  . Chronic cough 08/01/2019  . Snoring 08/01/2019  . Allergic rhinitis due to animal (cat) (dog) hair and dander 04/19/2019  . Allergic rhinitis due to pollen 04/19/2019  . Chronic allergic conjunctivitis 04/19/2019  . Gastro-esophageal reflux disease without esophagitis 04/19/2019  . Arthritis 04/13/2019  . Heart murmur 04/13/2019  . Hypertrophy of uterus 09/28/2018  . BMI 40.0-44.9, adult ([facility]) 11/14/2017  . Pes planus 10/18/2016  . Prediabetes 09/09/2016  . Encounter for routine gynecological examination 11/12/2014  . Family history of breast cancer 11/12/2014  . Obesity, Class II, BMI 35-39.9, with comorbidity 11/12/2014  . Uncomplicated asthma 10/24/2014  . History of female sterilization 08/05/2013  . Hypertension 10/31/2012    Harrold Fitchett, PT 06/18/2020, 12:32 PM  [facility] [address] [place], [place], [address] Phone: [phone]   Fax:  [phone]  Name: Brittney Marshall MRN: [id number] Date of Birth: 16-Nov-1975

## 2020-06-25 ENCOUNTER — Encounter: Admitting: Physical Therapy

## 2020-06-27 ENCOUNTER — Encounter: Payer: Self-pay | Admitting: Podiatry

## 2020-06-27 ENCOUNTER — Other Ambulatory Visit: Payer: Self-pay

## 2020-06-27 ENCOUNTER — Ambulatory Visit (INDEPENDENT_AMBULATORY_CARE_PROVIDER_SITE_OTHER)

## 2020-06-27 ENCOUNTER — Ambulatory Visit (INDEPENDENT_AMBULATORY_CARE_PROVIDER_SITE_OTHER): Admitting: Podiatry

## 2020-06-27 DIAGNOSIS — M960 Pseudarthrosis after fusion or arthrodesis: Secondary | ICD-10-CM

## 2020-06-27 DIAGNOSIS — M2060 Acquired deformities of toe(s), unspecified, unspecified foot: Secondary | ICD-10-CM

## 2020-07-01 NOTE — Progress Notes (Signed)
Subjective: Brittney Marshall is a 45 y.o. is seen today in office s/p Left 1st MTPJ arthrodesis preformed on 03/12/2020.  She is been doing physical therapy.  Manual therapy.  She feels that the swelling the color is doing much better.  No significant pain.  No recent injury or falls or changes otherwise.  She still taking over-the-counter vitamin D.  No new concerns that she reports today. Denies any systemic complaints such as fevers, chills, nausea, vomiting. No calf pain, chest pain, shortness of breath.   Objective: General: No acute distress, AAOx3  DP/PT pulses palpable 2/4, CRT < 3 sec to all digits.  Protective sensation intact. Motor function intact.  LEFT foot: Incision is well coapted without any evidence of dehiscence and scar is well formed.  No significant tenderness palpation along the surgical site.  Arthrodesis site is stable.  Decreased edema.  There is no erythema or warmth.  MMT 5/5 No other areas of tenderness to bilateral lower extremities.  No pain with calf compression, swelling, warmth, erythema.   Assessment and Plan:  Status post left foot surgery, nonunion arthrodesis site; DVT  -Treatment options discussed including all alternatives, risks, and complications -Repeat x-rays obtained were independent reviewed.  Hardware intact.  Awaiting radiology report. -Has point discussed with her starting to transition to regular shoe while at physical therapy.  As she progresses then she can transition back to regular shoe full-time.  Continue with bone cement R, elevation as well as compression anklet as she wears a regular shoe.  Continue over-the-counter vitamin D for now.  [name]

## 2020-07-02 ENCOUNTER — Ambulatory Visit (INDEPENDENT_AMBULATORY_CARE_PROVIDER_SITE_OTHER): Admitting: Rehabilitative and Restorative Service Providers"

## 2020-07-02 ENCOUNTER — Other Ambulatory Visit: Payer: Self-pay

## 2020-07-02 DIAGNOSIS — R29898 Other symptoms and signs involving the musculoskeletal system: Secondary | ICD-10-CM | POA: Diagnosis not present

## 2020-07-02 DIAGNOSIS — M79672 Pain in left foot: Secondary | ICD-10-CM | POA: Diagnosis not present

## 2020-07-02 NOTE — Therapy (Signed)
[facility] [place] [address] [place] [address] [place], [place], [address] Phone: [phone]   Fax:  [phone]  Physical Therapy Treatment  Patient Details  Name: Brittney Marshall MRN: [id number] Date of Birth: [birth year] Referring Provider (PT): Ovid Curd, DPM   Encounter Date: 07/02/2020   PT End of Session - 07/02/20 0948    Visit Number 5    Number of Visits 12    Date for PT Re-Evaluation 07/07/20    Authorization Type champ va    PT Start Time [id number]    PT Stop Time 1020    PT Time Calculation (min) 42 min    Activity Tolerance Patient tolerated treatment well    Behavior During Therapy [facility] for tasks assessed/performed           Past Medical History:  Diagnosis Date  . Allergic rhinitis   . GERD (gastroesophageal reflux disease)   . Heart murmur    per pt had since child;  had cardiology work-up done 09/ 2020 normal ETT and echo in care everywhere, ef 55-60%,  pt denies symptoms  . History of chest pain (03-11-2020  per pt denies any chest pain/ palpitations/ since   07/ 2020 ED visit @ [facility] (visit in care everywhere) complaint chest pain /palpitations, referred to cardiology;  lov in care everywhere 10-05-2018, K. Clovis Riley NP [facility], normal w/ no ishcemia ETT 10-03-2018 and echo done same day and EKG 04-06-2018 SR rate 95;  per note pt released prn  . History of TMJ syndrome   . Hypertension    followed by pcp   (pt had ETT 10-03-2018 normal with no ischemia, result in care everywhere)  . Mild intermittent asthma    followed by pulmonology---- dr Judie Petit. Su Monks---  (03-11-2020 per pt last exacerbation early 2020);  CT chest 06-27-2019 in care everywhre  . Pre-diabetes     Past Surgical History:  Procedure Laterality Date  . ARTHRODESIS METATARSALPHALANGEAL JOINT (MTPJ) Right 12-15-2017  @ [facility]  . DILATION AND CURETTAGE OF UTERUS  x3  last one 2004  . HALLUX FUSION Left 03/12/2020   Procedure: HALLUX FUSION  METATARSALPHALANGEAL JOINT;  Surgeon: Vivi Barrack, DPM;  Location: [facility] [place];  Service: Podiatry;  Laterality: Left;  . HALLUX VALGUS LAPIDUS Left 03/12/2020   Procedure: HALLUX VALGUS LAPIDUS INCLUDING BUNIONECTOMY OR;  Surgeon: Vivi Barrack, DPM;  Location: [facility] [place];  Service: Podiatry;  Laterality: Left;  CHOICE WITH LEG BLOCK  . LAPAROSCOPIC BILATERAL SALPINGECTOMY Bilateral 09-26-2013 @ [facility] in [place]   tubal  . ORIF ANKLE FRACTURE Right 2001   per pt hardware was removed    There were no vitals filed for this visit.   Subjective Assessment - 07/02/20 0945    Subjective The patient is wearing the tennis shoe for 3 hours/day.  She is using the bone stimulator.  She feels like she is having increased limping with gait since removing the cast shoe.    Pertinent History R foot bunionectomy in 2019; arthritis    Patient Stated Goals "be able to walk normally", reduce pain in hips    Currently in Pain? No/denies              [facility] PT Assessment - 07/02/20 0949      Assessment   Medical Diagnosis L ankle tendonitis, nontraumatic tear of plantar fascia, deformity of toe, bunion    Referring Provider (PT) Ovid Curd, DPM    Onset Date/Surgical Date  03/12/20                         [facility] Adult PT Treatment/Exercise - 07/02/20 0949      Ambulation/Gait   Ambulation/Gait Yes    Ambulation/Gait Assistance 7: Independent    Ambulation Distance (Feet) 300 Feet    Gait Comments cues on longer stance time L LE      Exercises   Exercises Knee/Hip;Ankle;Other Exercises    Other Exercises  quadriped hip extension alternating x 5 reps R and L sides      Knee/Hip Exercises: Aerobic   Nustep L5 x 4 minutes      Manual Therapy   Manual Therapy Joint mobilization;Soft tissue mobilization    Manual therapy comments to reduce muscle shortening    Joint Mobilization gentle mobs 2nd MTP PA    Soft tissue mobilization  STM L gastroc L soleous      Ankle Exercises: Stretches   Slant Board Stretch 1 rep;60 seconds    Other Stretch self mobilization to gastroc/soleous with muscle roller; quadriped gentle toe stretch by placing toes down and doing gentle rocking    Other Stretch tennis ball roll under foot      Ankle Exercises: Standing   Other Standing Ankle Exercises SLS L LE moving R foot onto 4" step and return without UEs    Other Standing Ankle Exercises standing step ups with L foot into march      Ankle Exercises: Seated   ABC's 1 rep   L foot                 PT Education - 07/02/20 1239    Education Details added gentle tennis ball roll for plantar surface massage due to tightness    Person(s) Educated Patient    Methods Explanation;Handout    Comprehension Verbalized understanding;Returned demonstration               PT Long Term Goals - 06/18/20 0942      PT LONG TERM GOAL #1   Title The patient will be indep with HEP.    Time 6    Period Weeks    Status On-going      PT LONG TERM GOAL #2   Title The patient will improve L ankle DF from -16 degrees neutral to 0 degrees.    Baseline 10 degrees L ankle DF.    Time 6    Period Weeks    Status Achieved      PT LONG TERM GOAL #3   Title The patient will improve functional status score from 33% to > or equal to 61%.    Time 6    Period Weeks    Status On-going      PT LONG TERM GOAL #4   Title The patient will demonstrate normalized gait pattern.    Time 6    Period Weeks    Status On-going      PT LONG TERM GOAL #5   Title The patient will improve 5 time sit<>stand to < or equal to 15 seconds.    Baseline 13.6 seconds    Time 6    Period Weeks    Status Achieved      PT LONG TERM GOAL #6   Title The patient will improve gait speed from 1.99 ft/sec to > or equal to 2.6 ft/sec.    Time 6    Period Weeks    Status  On-going                 Plan - 07/02/20 1241    Clinical Impression Statement The  patient is slowly progressing from cast shoe to tennis shoe.  PT recommended continued walking at short durations to ensure no increase in pain or swelling recommending 5 minutes at a time.  Also recommend continuing current HEP.  Plan to progress activity to patient tolerance.    PT Treatment/Interventions ADLs/Self Care Home Management;Taping;Patient/family education;Dry needling;Manual techniques;Therapeutic activities;Therapeutic exercise;Gait training;Stair training;Functional mobility training    PT Next Visit Plan Progress standing balance/ L single limb stance activities on firm and compliant surfaces, LE strength training for work to return to gym, progress ROM in ankle and hindfoot    PT Home Exercise Plan [email or web address]    Consulted and Agree with Plan of Care Patient           Patient will benefit from skilled therapeutic intervention in order to improve the following deficits and impairments:  Pain,Impaired flexibility,Decreased range of motion,Increased fascial restricitons,Decreased activity tolerance,Difficulty walking  Visit Diagnosis: Pain in left foot  Other symptoms and signs involving the musculoskeletal system     Problem List Patient Active Problem List   Diagnosis Date Noted  . Esophageal dysmotility 09/20/2019  . Regurgitation and rechewing 09/20/2019  . Laryngospasms 08/24/2019  . Chronic cough 08/01/2019  . Snoring 08/01/2019  . Allergic rhinitis due to animal (cat) (dog) hair and dander 04/19/2019  . Allergic rhinitis due to pollen 04/19/2019  . Chronic allergic conjunctivitis 04/19/2019  . Gastro-esophageal reflux disease without esophagitis 04/19/2019  . Arthritis 04/13/2019  . Heart murmur 04/13/2019  . Hypertrophy of uterus 09/28/2018  . BMI 40.0-44.9, adult (HCC) 11/14/2017  . Pes planus 10/18/2016  . Prediabetes 09/09/2016  . Encounter for routine gynecological examination 11/12/2014  . Family history of breast cancer 11/12/2014  . Obesity, Class  II, BMI 35-39.9, with comorbidity 11/12/2014  . Uncomplicated asthma 10/24/2014  . History of female sterilization 08/05/2013  . Hypertension 10/31/2012    Izzah Pasqua, PT 07/02/2020, 12:43 PM  [facility] [address] [place] [address] [place], [place], [address] Phone: [phone]   Fax:  [phone]  Name: Brittney Marshall MRN: [id number] Date of Birth: August 08, 1975

## 2020-07-09 ENCOUNTER — Encounter: Payer: Self-pay | Admitting: Physical Therapy

## 2020-07-09 ENCOUNTER — Other Ambulatory Visit: Payer: Self-pay

## 2020-07-09 ENCOUNTER — Telehealth: Payer: Self-pay | Admitting: Podiatry

## 2020-07-09 ENCOUNTER — Ambulatory Visit (INDEPENDENT_AMBULATORY_CARE_PROVIDER_SITE_OTHER): Admitting: Physical Therapy

## 2020-07-09 DIAGNOSIS — M79672 Pain in left foot: Secondary | ICD-10-CM | POA: Diagnosis not present

## 2020-07-09 DIAGNOSIS — R29898 Other symptoms and signs involving the musculoskeletal system: Secondary | ICD-10-CM | POA: Diagnosis not present

## 2020-07-09 NOTE — Therapy (Addendum)
[facility] [place] [address] [place] [address] [place], [place], [address] Phone: [phone]   Fax:  [phone]  Physical Therapy Treatment and Renewal Summary  Patient Details  Name: Brittney Marshall MRN: [id number] Date of Birth: [birth year] Referring Provider (PT): Ovid Curd, DPM   Encounter Date: 07/09/2020   PT End of Session - 07/09/20 0942     Visit Number 6    Number of Visits 12    Date for PT Re-Evaluation 07/07/20    Authorization Type champ va    PT Start Time 0939    PT Stop Time 1013    PT Time Calculation (min) 34 min    Activity Tolerance Patient tolerated treatment well    Behavior During Therapy [facility] for tasks assessed/performed             Past Medical History:  Diagnosis Date   Allergic rhinitis    GERD (gastroesophageal reflux disease)    Heart murmur    per pt had since child;  had cardiology work-up done 09/ 2020 normal ETT and echo in care everywhere, ef 55-60%,  pt denies symptoms   History of chest pain (03-11-2020  per pt denies any chest pain/ palpitations/ since   07/ 2020 ED visit @ [facility] (visit in care everywhere) complaint chest pain /palpitations, referred to cardiology;  lov in care everywhere 10-05-2018, K. Clovis Riley NP [facility], normal w/ no ishcemia ETT 10-03-2018 and echo done same day and EKG 04-06-2018 SR rate 95;  per note pt released prn   History of TMJ syndrome    Hypertension    followed by pcp   (pt had ETT 10-03-2018 normal with no ischemia, result in care everywhere)   Mild intermittent asthma    followed by pulmonology---- dr Judie Petit. Su Monks---  (03-11-2020 per pt last exacerbation early 2020);  CT chest 06-27-2019 in care everywhre   Pre-diabetes     Past Surgical History:  Procedure Laterality Date   ARTHRODESIS METATARSALPHALANGEAL JOINT (MTPJ) Right 12-15-2017  @ [facility]   DILATION AND CURETTAGE OF UTERUS  x3  last one 2004   HALLUX FUSION Left 03/12/2020   Procedure:  HALLUX FUSION METATARSALPHALANGEAL JOINT;  Surgeon: Vivi Barrack, DPM;  Location: [facility];  Service: Podiatry;  Laterality: Left;   HALLUX VALGUS LAPIDUS Left 03/12/2020   Procedure: HALLUX VALGUS LAPIDUS INCLUDING BUNIONECTOMY OR;  Surgeon: Vivi Barrack, DPM;  Location: [facility];  Service: Podiatry;  Laterality: Left;  CHOICE WITH LEG BLOCK   LAPAROSCOPIC BILATERAL SALPINGECTOMY Bilateral 09-26-2013 @ [facility] in [place]   tubal   ORIF ANKLE FRACTURE Right 2001   per pt hardware was removed    There were no vitals filed for this visit.   Subjective Assessment - 07/09/20 0940     Subjective Pt reports she is wearing tennis shoe 3 hrs/ day. Daily swelling, addressed with elevation and stocking.  Some soreness at end of day.    Patient Stated Goals "be able to walk normally", reduce pain in hips    Currently in Pain? No/denies    Pain Score 0-No pain                [facility] PT Assessment - 07/09/20 0001       Assessment   Medical Diagnosis L ankle tendonitis, nontraumatic tear of plantar fascia, deformity of toe, bunion    Referring Provider (PT) Ovid Curd, DPM    Onset Date/Surgical Date 03/12/20  Observation/Other Assessments   Focus on Therapeutic Outcomes (FOTO)  56% functional score      Ambulation/Gait   Ambulation Distance (Feet) 300 Feet             [facility] Adult PT Treatment/Exercise - 07/09/20 0001       Ambulation/Gait   Gait Comments tactile and verbal cues on longer stance time L LE and reciprocal arm swing, VC for heel strike bilat.      Exercises   Other Exercises  quadriped to/from single kneeling - Rt/Lt x 4 reps each. quadruped to hip sitting Rt/Lt.  quadruped to/from long sitting via high kneeling (to simulate getting in/out of tub).  quadruped rocking back forth with toes tucked for stretch.      Knee/Hip Exercises: Stretches   Theme park manager Both;20 seconds;2 reps   incline board      Knee/Hip Exercises: Standing   Step Down Right;1 set;Step Height: 4";Hand Hold: 2   8 reps, retro step up with LLE              PT Long Term Goals - 06/18/20 0942       PT LONG TERM GOAL #1   Title The patient will be indep with HEP.    Time 6    Period Weeks    Status On-going      PT LONG TERM GOAL #2   Title The patient will improve L ankle DF from -16 degrees neutral to 0 degrees.    Baseline 10 degrees L ankle DF.    Time 6    Period Weeks    Status Achieved      PT LONG TERM GOAL #3   Title The patient will improve functional status score from 33% to > or equal to 61%.    Time 6    Period Weeks    Status On-going      PT LONG TERM GOAL #4   Title The patient will demonstrate normalized gait pattern.    Time 6    Period Weeks    Status On-going      PT LONG TERM GOAL #5   Title The patient will improve 5 time sit<>stand to < or equal to 15 seconds.    Baseline 13.6 seconds    Time 6    Period Weeks    Status Achieved      PT LONG TERM GOAL #6   Title The patient will improve gait speed from 1.99 ft/sec to > or equal to 2.6 ft/sec.    Time 6    Period Weeks    Status On-going             UPDATED LONG TERM GOALS:  PT Long Term Goals - 07/10/20 1156       PT LONG TERM GOAL #1   Title The patient will be indep with HEP.    Time 6    Period Weeks    Status Revised    Target Date 08/20/20      PT LONG TERM GOAL #2   Title The patient will improve L ankle DF from -16 degrees neutral to 0 degrees.    Baseline 10 degrees L ankle DF.    Time 6    Period Weeks    Status Achieved      PT LONG TERM GOAL #3   Title The patient will improve functional status score from 33% to > or equal to 61%.    Time 6  Period Weeks    Status Revised    Target Date 08/20/20      PT LONG TERM GOAL #4   Title The patient will demonstrate normalized gait pattern.    Time 6    Period Weeks    Status Revised    Target Date 08/20/20      PT LONG TERM GOAL  #5   Title The patient will improve 5 time sit<>stand to < or equal to 15 seconds.    Baseline 13.6 seconds    Time 6    Period Weeks    Status Achieved      PT LONG TERM GOAL #6   Title The patient will improve gait speed from 1.99 ft/sec to > or equal to 2.6 ft/sec.    Time 6    Period Weeks    Status Revised    Target Date 08/20/20                  Plan - 07/09/20 1141     Clinical Impression Statement Gait quality improved with cues for increased stance time and slowing speed of gait to focus on quality instead of speed.  Spent time exercising in quadruped and high kneeling to assist pt with getting in/out of tub at home with less assistance. She tolerated all exercises without increase in pain. FOTO score has improved to 56%.  Pt is making good gains towards LTGs and will benefit from continued PT intervention to maximize functional mobility.    PT Treatment/Interventions ADLs/Self Care Home Management;Taping;Patient/family education;Dry needling;Manual techniques;Therapeutic activities;Therapeutic exercise;Gait training;Stair training;Functional mobility training    PT Next Visit Plan Progress standing balance/ L single limb stance activities on firm and compliant surfaces, LE strength training for work to return to gym, progress ROM in ankle and hindfoot.  Add aquatic therapy as able.    PT Home Exercise Plan [email or web address]    Consulted and Agree with Plan of Care Patient             Patient will benefit from skilled therapeutic intervention in order to improve the following deficits and impairments:  Pain,Impaired flexibility,Decreased range of motion,Increased fascial restricitons,Decreased activity tolerance,Difficulty walking  Visit Diagnosis: Pain in left foot  Other symptoms and signs involving the musculoskeletal system     Problem List Patient Active Problem List   Diagnosis Date Noted   Esophageal dysmotility 09/20/2019   Regurgitation and rechewing  09/20/2019   Laryngospasms 08/24/2019   Chronic cough 08/01/2019   Snoring 08/01/2019   Allergic rhinitis due to animal (cat) (dog) hair and dander 04/19/2019   Allergic rhinitis due to pollen 04/19/2019   Chronic allergic conjunctivitis 04/19/2019   Gastro-esophageal reflux disease without esophagitis 04/19/2019   Arthritis 04/13/2019   Heart murmur 04/13/2019   Hypertrophy of uterus 09/28/2018   BMI 40.0-44.9, adult (HCC) 11/14/2017   Pes planus 10/18/2016   Prediabetes 09/09/2016   Encounter for routine gynecological examination 11/12/2014   Family history of breast cancer 11/12/2014   Obesity, Class II, BMI 35-39.9, with comorbidity 11/12/2014   Uncomplicated asthma 10/24/2014   History of female sterilization 08/05/2013   Hypertension 10/31/2012    Margretta Ditty, PT  Mayer Camel, PTA 07/09/20 11:46 AM  [facility] [place] [address] [place] [address] [place], [place], [address] Phone: [phone]   Fax:  [phone]  Name: Brittney Marshall MRN: [id number] Date of Birth: 09-24-1975

## 2020-07-09 NOTE — Telephone Encounter (Signed)
I called and spoke with the patient today. She states she is doing great and PT is helping and she is getting back into a regular shoe. However she is asking to extend her out of work for an additional week, to go back on June 27th as she is not fully back into a shoe and she knows once she is back at work she is going to be on her feet a lot. Marylene Land, can you take care of this? Thanks!

## 2020-07-09 NOTE — Telephone Encounter (Signed)
Patient is requesting a call back in regards to some questions/concerns she has about her foot after having surgery.

## 2020-07-10 ENCOUNTER — Encounter: Payer: Self-pay | Admitting: Podiatry

## 2020-07-10 NOTE — Addendum Note (Signed)
Addended by: Margretta Ditty M on: 07/10/2020 11:59 AM   Modules accepted: Orders

## 2020-07-11 NOTE — Telephone Encounter (Signed)
Patient calling to state that she can not access her letter via MyChart. Will she need to come in to pick up her extended out of work note ffrom6/9?

## 2020-07-14 NOTE — Telephone Encounter (Signed)
I just spoke with patient, I told her that her email must be blocked on her end, I was unable to send attachment with letter. She said that she will send her Husband to pick letter and notes up. I tried to send again through email while on the phone, will callback to see if she received it, but I told her if not Letter and notes are at front desk for her. She states"understanding".

## 2020-07-15 ENCOUNTER — Ambulatory Visit (INDEPENDENT_AMBULATORY_CARE_PROVIDER_SITE_OTHER): Admitting: Rehabilitative and Restorative Service Providers"

## 2020-07-15 ENCOUNTER — Other Ambulatory Visit: Payer: Self-pay

## 2020-07-15 DIAGNOSIS — R29898 Other symptoms and signs involving the musculoskeletal system: Secondary | ICD-10-CM

## 2020-07-15 DIAGNOSIS — M79672 Pain in left foot: Secondary | ICD-10-CM | POA: Diagnosis not present

## 2020-07-15 NOTE — Patient Instructions (Signed)
Access Code: [email or web address] URL: [email or web address] Date: 07/15/2020 Prepared by: Margretta Ditty  Program Notes WALKING PROGRAM: Week 1 (5-8 min) at easy pace.  Week 2 (8-10 min) at easy pace, Week 3 (10-12 min) at easy pace. *only progress if you don't have swelling or pain.   Exercises Prone Quadriceps Stretch with Strap - 1 x daily - 7 x weekly - 3 sets - 2-3 reps - 20-30 seconds hold Seated Hamstring Stretch with Chair - 2 x daily - 7 x weekly - 1 sets - 2-3 reps - 20-30 seconds hold Seated Plantar Fascia Mobilization with Small Ball - 2 x daily - 7 x weekly - 1 sets - 10 reps Sit to Stand - 2 x daily - 7 x weekly - 1 sets - 5-10 reps Gastroc Stretch on Wall - 2 x daily - 7 x weekly - 1 sets - 3 reps - 30 seconds hold Standing Soleus Stretch - 2 x daily - 7 x weekly - 1 sets - 3 reps - 20-30 seconds hold Step Up - 2 x daily - 7 x weekly - 1 sets - 10 reps

## 2020-07-15 NOTE — Therapy (Signed)
[facility] [place] [address] [place], [place], [address] Phone: [phone]   Fax:  [phone]  Physical Therapy Treatment  Patient Details  Name: Brittney Marshall MRN: [id number] Date of Birth: 10-15-1975 Referring Provider (PT): Ovid Curd, DPM   Encounter Date: 07/15/2020   PT End of Session - 07/15/20 1247     Visit Number 7    Number of Visits 12    Date for PT Re-Evaluation 08/20/20    Authorization Type champ va    PT Start Time [phone]    PT Stop Time 1020    PT Time Calculation (min) 42 min    Activity Tolerance Patient tolerated treatment well    Behavior During Therapy [facility] for tasks assessed/performed             Past Medical History:  Diagnosis Date   Allergic rhinitis    GERD (gastroesophageal reflux disease)    Heart murmur    per pt had since child;  had cardiology work-up done 09/ 2020 normal ETT and echo in care everywhere, ef 55-60%,  pt denies symptoms   History of chest pain (03-11-2020  per pt denies any chest pain/ palpitations/ since   07/ 2020 ED visit @ [facility] (visit in care everywhere) complaint chest pain /palpitations, referred to cardiology;  lov in care everywhere 10-05-2018, K. Clovis Riley NP [facility] normal w/ no ishcemia ETT 10-03-2018 and echo done same day and EKG 04-06-2018 SR rate 95;  per note pt released prn   History of TMJ syndrome    Hypertension    followed by pcp   (pt had ETT 10-03-2018 normal with no ischemia, result in care everywhere)   Mild intermittent asthma    followed by pulmonology---- dr Judie Petit. Su Monks---  (03-11-2020 per pt last exacerbation early 2020);  CT chest 06-27-2019 in care everywhre   Pre-diabetes     Past Surgical History:  Procedure Laterality Date   ARTHRODESIS METATARSALPHALANGEAL JOINT (MTPJ) Right 12-15-2017  @ [facility]   DILATION AND CURETTAGE OF UTERUS  x3  last one 2004   HALLUX FUSION Left 03/12/2020   Procedure: HALLUX FUSION  METATARSALPHALANGEAL JOINT;  Surgeon: Vivi Barrack, DPM;  Location: [facility];  Service: Podiatry;  Laterality: Left;   HALLUX VALGUS LAPIDUS Left 03/12/2020   Procedure: HALLUX VALGUS LAPIDUS INCLUDING BUNIONECTOMY OR;  Surgeon: Vivi Barrack, DPM;  Location: [facility];  Service: Podiatry;  Laterality: Left;  CHOICE WITH LEG BLOCK   LAPAROSCOPIC BILATERAL SALPINGECTOMY Bilateral 09-26-2013 @ [facility] in [place]   tubal   ORIF ANKLE FRACTURE Right 2001   per pt hardware was removed    There were no vitals filed for this visit.   Subjective Assessment - 07/15/20 0939     Subjective The patient reports that she is getting swelling over big toe and she thinks it is where the edge of compression hose are.  PT discussed fully included toe for compression hose.  She gets occasional pain in her legs that is temporary.    Pertinent History R foot bunionectomy in 2019; arthritis    Patient Stated Goals "be able to walk normally", reduce pain in hips    Currently in Pain? No/denies                [facility] PT Assessment - 07/15/20 0946       Assessment   Medical Diagnosis L ankle tendonitis, nontraumatic tear of plantar fascia, deformity of  toe, bunion    Referring Provider (PT) Ovid Curd, DPM    Onset Date/Surgical Date 03/12/20                           [facility] Adult PT Treatment/Exercise - 07/15/20 0947       Ambulation/Gait   Ambulation/Gait Yes    Ambulation/Gait Assistance 7: Independent    Ambulation Distance (Feet) 300 Feet      Self-Care   Self-Care Other Self-Care Comments    Other Self-Care Comments  STM soleous      Exercises   Exercises Knee/Hip;Ankle;Other Exercises    Other Exercises  tandem stance activities for hindfoot lateral compensation for balance, compliant single leg stance activities      Knee/Hip Exercises: Stretches   Gastroc Stretch Right;Left;2 reps;30 seconds    Soleus Stretch Left;2  reps;30 seconds    Other Knee/Hip Stretches slant board standing      Knee/Hip Exercises: Standing   Lateral Step Up Right;Left;10 reps    Functional Squat 10 reps    Functional Squat Limitations reaching to touch top of kettle bell.  Pt feels limited due to medial lower leg *PT added soleous stretching                    PT Education - 07/15/20 1016     Education Details HEP    Person(s) Educated Patient    Methods Explanation;Demonstration;Handout    Comprehension Verbalized understanding;Returned demonstration                 PT Long Term Goals - 07/15/20 1249       PT LONG TERM GOAL #1   Title The patient will be indep with HEP.    Time 6    Period Weeks    Status On-going      PT LONG TERM GOAL #2   Title The patient will improve L ankle DF from -16 degrees neutral to 0 degrees.    Baseline 10 degrees L ankle DF.    Time 6    Period Weeks    Status Achieved      PT LONG TERM GOAL #3   Title The patient will improve functional status score from 33% to > or equal to 61%.    Baseline 55%    Time 6    Period Weeks    Status On-going      PT LONG TERM GOAL #4   Title The patient will demonstrate normalized gait pattern.    Time 6    Period Weeks    Status On-going      PT LONG TERM GOAL #5   Title The patient will improve 5 time sit<>stand to < or equal to 15 seconds.    Baseline 13.6 seconds    Time 6    Period Weeks    Status Achieved      PT LONG TERM GOAL #6   Title The patient will improve gait speed from 1.99 ft/sec to > or equal to 2.6 ft/sec.    Time 6    Period Weeks    Status On-going                   Plan - 07/15/20 1021     Clinical Impression Statement The patient initially uses L knee recurvatum to obtain improved stride length with gait.  After stretching gastroc and soleous musculature, she demonstrates dec'd L knee recurvatum.  PT working on hindfoot and mid foot mobility through balance activities to help  with normalizing gait due to fused 1st L MTP.  Plan to continue to work to unmet LTGs anticipating need for aquatics and progression of return to gym activities.    PT Treatment/Interventions ADLs/Self Care Home Management;Taping;Patient/family education;Dry needling;Manual techniques;Therapeutic activities;Therapeutic exercise;Gait training;Stair training;Functional mobility training;Aquatic Therapy    PT Next Visit Plan Progress standing balance/ L single limb stance activities on firm and compliant surfaces, LE strength training for work to return to gym, progress ROM in ankle and hindfoot.  Add aquatic therapy as able.    PT Home Exercise Plan [id number]    Consulted and Agree with Plan of Care Patient             Patient will benefit from skilled therapeutic intervention in order to improve the following deficits and impairments:  Pain, Impaired flexibility, Decreased range of motion, Increased fascial restricitons, Decreased activity tolerance, Difficulty walking  Visit Diagnosis: Pain in left foot  Other symptoms and signs involving the musculoskeletal system     Problem List Patient Active Problem List   Diagnosis Date Noted   Esophageal dysmotility 09/20/2019   Regurgitation and rechewing 09/20/2019   Laryngospasms 08/24/2019   Chronic cough 08/01/2019   Snoring 08/01/2019   Allergic rhinitis due to animal (cat) (dog) hair and dander 04/19/2019   Allergic rhinitis due to pollen 04/19/2019   Chronic allergic conjunctivitis 04/19/2019   Gastro-esophageal reflux disease without esophagitis 04/19/2019   Arthritis 04/13/2019   Heart murmur 04/13/2019   Hypertrophy of uterus 09/28/2018   BMI 40.0-44.9, adult (HCC) 11/14/2017   Pes planus 10/18/2016   Prediabetes 09/09/2016   Encounter for routine gynecological examination 11/12/2014   Family history of breast cancer 11/12/2014   Obesity, Class II, BMI 35-39.9, with comorbidity 11/12/2014   Uncomplicated asthma 10/24/2014    History of female sterilization 08/05/2013   Hypertension 10/31/2012   Shalece Staffa, PT  Erby Sanderson 07/15/2020, 12:51 PM  [facility] [address] [place] [address] [place], [place], [address] Phone: [phone]   Fax:  [phone]  Name: CHALEE HIROTA MRN: [id number] Date of Birth: 04-19-1975

## 2020-07-18 ENCOUNTER — Encounter: Payer: Self-pay | Admitting: Podiatry

## 2020-07-18 ENCOUNTER — Other Ambulatory Visit: Payer: Self-pay

## 2020-07-18 ENCOUNTER — Ambulatory Visit (INDEPENDENT_AMBULATORY_CARE_PROVIDER_SITE_OTHER): Admitting: Podiatry

## 2020-07-18 DIAGNOSIS — M960 Pseudarthrosis after fusion or arthrodesis: Secondary | ICD-10-CM

## 2020-07-18 DIAGNOSIS — E559 Vitamin D deficiency, unspecified: Secondary | ICD-10-CM

## 2020-07-18 DIAGNOSIS — M2060 Acquired deformities of toe(s), unspecified, unspecified foot: Secondary | ICD-10-CM

## 2020-07-18 LAB — VITAMIN D 25 HYDROXY (VIT D DEFICIENCY, FRACTURES): Vit D, 25-Hydroxy: 35 ng/mL (ref 30–100)

## 2020-07-18 NOTE — Progress Notes (Signed)
Subjective: Brittney Marshall is a 45 y.o. is seen today in office s/p Left 1st MTPJ arthrodesis preformed on 03/12/2020.  She is been doing physical therapy which he states is very helpful.  She is back to regular shoe and not having significant discomfort.  She does not get pain on a daily basis and she does get discomfort is only for a short amount time.  Swelling also has improved she reports.  For compression anklet that she is wearing on the toes.  She is also getting a vitamin D level rechecked.  She is scheduled for repeat ultrasounds today for her leg, DVT. Denies any systemic complaints such as fevers, chills, nausea, vomiting. No calf pain, chest pain, shortness of breath.   Objective: General: No acute distress, AAOx3  DP/PT pulses palpable 2/4, CRT < 3 sec to all digits.  Protective sensation intact. Motor function intact.  LEFT foot: Incision is well coapted without any evidence of dehiscence and scar is well formed.  There is no tenderness palpation to the surgical site.  Arthrodesis appears to be stable.  There is mild edema but appears to be improving as well.  There is no erythema or warmth.  No skin breakdown identified today.  Toe is in rectus position.  No other areas of discomfort. No other areas of tenderness to bilateral lower extremities.  No pain with calf compression, swelling, warmth, erythema.   Assessment and Plan:  Status post left foot surgery, nonunion arthrodesis site; DVT  -Treatment options discussed including all alternatives, risks, and complications -Overall she is making good progress with physical therapy which we will continue with.  She can start a small walking program to help as well.  Continue with compression sock.  To give her a prescription for compression socks that we can do in the future if needed.  Awaiting new ultrasound of her legs blood will be on blood thinners for 6 months she reports from the time of the DVT.  We will recheck a vitamin D level  at her request today. -She is scheduled about to work in about a week.  Discussed with her returning to work with restrictions.  She needs intermittent FMLA 2 times a week for physical therapy as well as doctors appointments.  She can work 40 hours a week Monday through Friday.  [name]

## 2020-07-21 ENCOUNTER — Encounter: Payer: Self-pay | Admitting: Podiatry

## 2020-07-23 ENCOUNTER — Ambulatory Visit (INDEPENDENT_AMBULATORY_CARE_PROVIDER_SITE_OTHER): Admitting: Rehabilitative and Restorative Service Providers"

## 2020-07-23 ENCOUNTER — Other Ambulatory Visit: Payer: Self-pay

## 2020-07-23 DIAGNOSIS — R29898 Other symptoms and signs involving the musculoskeletal system: Secondary | ICD-10-CM | POA: Diagnosis not present

## 2020-07-23 DIAGNOSIS — M79672 Pain in left foot: Secondary | ICD-10-CM | POA: Diagnosis not present

## 2020-07-23 NOTE — Therapy (Signed)
[facility] [place] [address] [place] [address] [place], [place], [address] Phone: [phone]   Fax:  [phone]  Physical Therapy Treatment  Patient Details  Name: Brittney Marshall MRN: [id number] Date of Birth: [birth year] Referring Provider (PT): Ovid Curd, DPM   Encounter Date: 07/23/2020   PT End of Session - 07/23/20 0940     Visit Number 8    Number of Visits 12    Date for PT Re-Evaluation 08/20/20    Authorization Type champ va    PT Start Time [phone]    PT Stop Time 1016    PT Time Calculation (min) 38 min    Activity Tolerance Patient tolerated treatment well    Behavior During Therapy [facility] for tasks assessed/performed             Past Medical History:  Diagnosis Date   Allergic rhinitis    GERD (gastroesophageal reflux disease)    Heart murmur    per pt had since child;  had cardiology work-up done 09/ 2020 normal ETT and echo in care everywhere, ef 55-60%,  pt denies symptoms   History of chest pain (03-11-2020  per pt denies any chest pain/ palpitations/ since   07/ 2020 ED visit @ [facility] (visit in care everywhere) complaint chest pain /palpitations, referred to cardiology;  lov in care everywhere 10-05-2018, K. Clovis Riley NP [facility], normal w/ no ishcemia ETT 10-03-2018 and echo done same day and EKG 04-06-2018 SR rate 95;  per note pt released prn   History of TMJ syndrome    Hypertension    followed by pcp   (pt had ETT 10-03-2018 normal with no ischemia, result in care everywhere)   Mild intermittent asthma    followed by pulmonology---- dr Judie Petit. Su Monks---  (03-11-2020 per pt last exacerbation early 2020);  CT chest 06-27-2019 in care everywhre   Pre-diabetes     Past Surgical History:  Procedure Laterality Date   ARTHRODESIS METATARSALPHALANGEAL JOINT (MTPJ) Right 12-15-2017  @ [facility]   DILATION AND CURETTAGE OF UTERUS  x3  last one 2004   HALLUX FUSION Left 03/12/2020   Procedure: HALLUX FUSION  METATARSALPHALANGEAL JOINT;  Surgeon: Vivi Barrack, DPM;  Location: [facility];  Service: Podiatry;  Laterality: Left;   HALLUX VALGUS LAPIDUS Left 03/12/2020   Procedure: HALLUX VALGUS LAPIDUS INCLUDING BUNIONECTOMY OR;  Surgeon: Vivi Barrack, DPM;  Location: [facility] [place];  Service: Podiatry;  Laterality: Left;  CHOICE WITH LEG BLOCK   LAPAROSCOPIC BILATERAL SALPINGECTOMY Bilateral 09-26-2013 @ [facility] in [place]   tubal   ORIF ANKLE FRACTURE Right 2001   per pt hardware was removed    There were no vitals filed for this visit.   Subjective Assessment - 07/23/20 0939     Subjective The patient reports that she walked a mile on Monday.  She got a little sore during the walk, but no increased swelling.  She is returning to work next week with intermittent FMLA.    Pertinent History R foot bunionectomy in 2019; arthritis    Patient Stated Goals "be able to walk normally", reduce pain in hips    Currently in Pain? No/denies                [facility] PT Assessment - 07/23/20 0941       Assessment   Medical Diagnosis L ankle tendonitis, nontraumatic tear of plantar fascia, deformity of toe, bunion    Referring Provider (PT) Molli Hazard  Ardelle Anton, DPM    Onset Date/Surgical Date 03/12/20                           [facility] Adult PT Treatment/Exercise - 07/23/20 0941       Ambulation/Gait   Ambulation/Gait Yes    Ambulation/Gait Assistance 7: Independent    Ambulation Distance (Feet) 500 Feet    Gait velocity 3.5 ft/sec    Stairs Yes    Stairs Assistance 6: Modified independent (Device/Increase time)    Stair Management Technique One rail Right;Alternating pattern    Number of Stairs 13    Gait Comments gait with dec'd DF noted, good stride length and equal weight shift      Self-Care   Self-Care Other Self-Care Comments    Other Self-Care Comments  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      Knee/Hip Exercises: Standing   Functional Squat 10 reps      Ankle Exercises: Standing   SLS 10-15 seconds R and L sides    Toe Raise 15 reps   dec'ing UE use                   PT Education - 07/23/20 1203     Education Details HEP    Person(s) Educated Patient    Methods Explanation;Demonstration;Handout    Comprehension Verbalized understanding;Returned demonstration                 PT Long Term Goals - 07/23/20 9450       PT LONG TERM GOAL #1   Title The patient will be indep with HEP.    Time 6    Period Weeks    Status On-going      PT LONG TERM GOAL #2   Title The patient will improve L ankle DF from -16 degrees neutral to 0 degrees.    Baseline 10 degrees L ankle DF.    Time 6    Period Weeks    Status Achieved      PT LONG TERM GOAL #3   Title The patient will improve functional status score from 33% to > or equal to 61%.    Baseline 55%    Time 6    Period Weeks    Status On-going      PT LONG TERM GOAL #4   Title The patient will demonstrate normalized gait pattern.    Time 6    Period Weeks    Status Achieved      PT LONG TERM GOAL #5   Title The patient will improve 5 time sit<>stand to < or equal to 15 seconds.    Baseline 13.6 seconds    Time 6    Period Weeks    Status Achieved      PT LONG TERM GOAL #6   Title The patient will improve gait speed from 1.99 ft/sec to > or equal to 2.6 ft/sec.    Baseline 3.5 ft/sec    Time 6  Period Weeks    Status Achieved                   Plan - 07/23/20 1204     Clinical Impression Statement The patient is continuing to progress towards LTGS and has plans  to return to work next week.  PT continuing to progress home exercises and return to prior gym routine.    PT Treatment/Interventions ADLs/Self Care Home Management;Taping;Patient/family education;Dry needling;Manual techniques;Therapeutic activities;Therapeutic exercise;Gait training;Stair training;Functional mobility training;Aquatic Therapy    PT Next Visit Plan Progress standing balance/ L single limb stance activities on firm and compliant surfaces, LE strength training for work to return to gym, progress ROM in ankle and hindfoot.  Add aquatic therapy as able.    PT Home Exercise Plan [id number]    Consulted and Agree with Plan of Care Patient             Patient will benefit from skilled therapeutic intervention in order to improve the following deficits and impairments:     Visit Diagnosis: Pain in left foot  Other symptoms and signs involving the musculoskeletal system     Problem List Patient Active Problem List   Diagnosis Date Noted   Esophageal dysmotility 09/20/2019   Regurgitation and rechewing 09/20/2019   Laryngospasms 08/24/2019   Chronic cough 08/01/2019   Snoring 08/01/2019   Allergic rhinitis due to animal (cat) (dog) hair and dander 04/19/2019   Allergic rhinitis due to pollen 04/19/2019   Chronic allergic conjunctivitis 04/19/2019   Gastro-esophageal reflux disease without esophagitis 04/19/2019   Arthritis 04/13/2019   Heart murmur 04/13/2019   Hypertrophy of uterus 09/28/2018   BMI 40.0-44.9, adult (HCC) 11/14/2017   Pes planus 10/18/2016   Prediabetes 09/09/2016   Encounter for routine gynecological examination 11/12/2014   Family history of breast cancer 11/12/2014   Obesity, Class II, BMI 35-39.9, with comorbidity 11/12/2014   Uncomplicated asthma 10/24/2014   History of female sterilization 08/05/2013   Hypertension 10/31/2012    Aladdin Kollmann, PT 07/23/2020, 12:04 PM  [facility] [place] [address] [place]  [address] [place], [place], [address] Phone: [phone]   Fax:  [phone]  Name: Brittney Marshall MRN: [id number] Date of Birth: 1975-08-05

## 2020-07-24 ENCOUNTER — Encounter: Payer: Self-pay | Admitting: Podiatry

## 2020-07-25 ENCOUNTER — Encounter: Payer: Self-pay | Admitting: Podiatry

## 2020-07-30 ENCOUNTER — Encounter: Admitting: Rehabilitative and Restorative Service Providers"

## 2020-08-01 ENCOUNTER — Ambulatory Visit: Admitting: Physical Therapy

## 2020-08-06 ENCOUNTER — Encounter: Payer: Self-pay | Admitting: Podiatry

## 2020-08-06 ENCOUNTER — Telehealth: Payer: Self-pay | Admitting: Podiatry

## 2020-08-06 NOTE — Telephone Encounter (Signed)
Patient states that she in her 2nd week at work and she has been having swelling and pain. She states she is not able to elevate when she gets home. She states that work has not been allowing her to elevate her foot and she has been on her feet a lot since going back to work.   She is asking for a letter stating she needs to elevate her feet with ice at least 3 times per 8 hours shift and also limited standing/walking or allow her to work at home if they cannot accommodate the restrictions.   The patient has a form that she can e-mail to you.

## 2020-08-06 NOTE — Telephone Encounter (Signed)
Patient is requesting a call back in regards to questions/concerns about surgical foot (left). Please advise

## 2020-08-07 ENCOUNTER — Ambulatory Visit (INDEPENDENT_AMBULATORY_CARE_PROVIDER_SITE_OTHER): Admitting: Physical Therapy

## 2020-08-07 ENCOUNTER — Other Ambulatory Visit: Payer: Self-pay

## 2020-08-07 ENCOUNTER — Encounter: Payer: Self-pay | Admitting: Physical Therapy

## 2020-08-07 DIAGNOSIS — R29898 Other symptoms and signs involving the musculoskeletal system: Secondary | ICD-10-CM | POA: Diagnosis not present

## 2020-08-07 DIAGNOSIS — M79672 Pain in left foot: Secondary | ICD-10-CM

## 2020-08-07 NOTE — Patient Instructions (Signed)
Aquatic Therapy: What to Expect!  Where:  [facility] [address] [place], [place] [address] [phone]           How to Prepare: Please make sure you drink 8 ounces of water about one hour prior to your pool session A caregiver must attend the entire session with the patient (unless your primary therapists feels this is not necessary). The caregiver will be responsible for assisting with dressing as well as any toileting needs.  Please arrive IN YOUR SUIT and a few minutes prior to your appointment - this helps to avoid delays in starting your session. Please make sure to attend to any toileting needs prior to entering the pool. Once on the pool deck your therapist will ask you to sign the Patient  Consent and Assignment of Benefits form. Your therapist may take your blood pressure prior to, during and after your session if indicated. We usually try and create a home exercise program based on activities we do in the pool.  Please be thinking about who might be able to assist you in the pool should you want to participate in an aquatic home exercise program at the time of discharge.  Some patients do not want to or do not have the ability to participate in an aquatic home program - this is not a barrier in any way to you participating in aquatic therapy as part of your current therapy plan!  Appointments:  All sessions are 45 minutes  About the pool: Entering the pool Your therapist will assist you; there are two ways to enter the pool - stairs or a mechanical lift. Your therapist will determine the most appropriate way for you. Water temperature is usually around 90-91.  There is a lap pool with a temperature around 84 There may be other swimmers in the pool at the same time.   Contact Info:             To cancel appointment, please call [facility]per at [phone] If you are running late, please call [facility] at  [phone]                  Kinesiology tape What is kinesiology tape?  There are many brands of kinesiology tape.  KTape, [employer], [employer], Dynamic tape, to name a few. It is an elasticized tape designed to support the body's natural healing process. This tape provides stability and support to muscles and joints without restricting motion. It can also help decrease swelling in the area of application. How does it work? The tape microscopically lifts and decompresses the skin to allow for drainage of lymph (swelling) to flow away from area, reducing inflammation.  The tape has the ability to help re-educate the neuromuscular system by targeting specific receptors in the skin.  The presence of the tape increases the body's awareness of posture and body mechanics.  Do not use with: Open wounds Skin lesions Adhesive allergies Safe removal of the tape: In some rare cases, mild/moderate skin irritation can occur.  This can include redness, itchiness, or hives. If this occurs, immediately remove tape and consult your primary care physician if symptoms are severe or do not resolve within 2 days.  To remove tape safely, hold nearby skin with one hand and gentle roll tape down with other hand.  You can apply oil or conditioner to tape while in shower prior to removal to loosen adhesive.  DO NOT swiftly rip tape  off like a band-aid, as this could cause skin tears and additional skin irritation.

## 2020-08-07 NOTE — Therapy (Signed)
[facility] [place] [address] [place], [place], [address] Phone: [phone]   Fax:  [phone]  Physical Therapy Treatment  Patient Details  Name: Brittney Marshall MRN: [id number] Date of Birth: 10/17/1975 Referring Provider (PT): Ovid Curd, DPM   Encounter Date: 08/07/2020   PT End of Session - 08/07/20 1635     Visit Number 9    Number of Visits 12    Date for PT Re-Evaluation 08/20/20    Authorization Type champ va    PT Start Time 1615    PT Stop Time 1650    PT Time Calculation (min) 35 min    Activity Tolerance Patient limited by pain    Behavior During Therapy [facility] for tasks assessed/performed             Past Medical History:  Diagnosis Date   Allergic rhinitis    GERD (gastroesophageal reflux disease)    Heart murmur    per pt had since child;  had cardiology work-up done 09/ 2020 normal ETT and echo in care everywhere, ef 55-60%,  pt denies symptoms   History of chest pain (03-11-2020  per pt denies any chest pain/ palpitations/ since   07/ 2020 ED visit @ [facility] (visit in care everywhere) complaint chest pain /palpitations, referred to cardiology;  lov in care everywhere 10-05-2018, K. Clovis Riley NP [facility], normal w/ no ishcemia ETT 10-03-2018 and echo done same day and EKG 04-06-2018 SR rate 95;  per note pt released prn   History of TMJ syndrome    Hypertension    followed by pcp   (pt had ETT 10-03-2018 normal with no ischemia, result in care everywhere)   Mild intermittent asthma    followed by pulmonology---- dr Judie Petit. Su Monks---  (03-11-2020 per pt last exacerbation early 2020);  CT chest 06-27-2019 in care everywhre   Pre-diabetes     Past Surgical History:  Procedure Laterality Date   ARTHRODESIS METATARSALPHALANGEAL JOINT (MTPJ) Right 12-15-2017  @ [facility]   DILATION AND CURETTAGE OF UTERUS  x3  last one 2004   HALLUX FUSION Left 03/12/2020   Procedure: HALLUX FUSION  METATARSALPHALANGEAL JOINT;  Surgeon: Vivi Barrack, DPM;  Location: [facility] [place];  Service: Podiatry;  Laterality: Left;   HALLUX VALGUS LAPIDUS Left 03/12/2020   Procedure: HALLUX VALGUS LAPIDUS INCLUDING BUNIONECTOMY OR;  Surgeon: Vivi Barrack, DPM;  Location: [facility] [place] [facility];  Service: Podiatry;  Laterality: Left;  CHOICE WITH LEG BLOCK   LAPAROSCOPIC BILATERAL SALPINGECTOMY Bilateral 09-26-2013 @ [facility] in [place]   tubal   ORIF ANKLE FRACTURE Right 2001   per pt hardware was removed    There were no vitals filed for this visit.   Subjective Assessment - 08/07/20 1622     Subjective Pt reports she returned to work 2 wks ago.  She has had more pain and swelling in her Lt toe.  She has not been able to elevate and ice her foot during work day. She has requested some accommodations at work.    Currently in Pain? Yes    Pain Score 8     Pain Location Toe (great toe)    Pain Orientation Left , sore, sharp.  Elevation and Ice make it feel better; prolonged standing makes it feel worse.                [facility] PT Assessment - 08/07/20 0001       Assessment  Medical Diagnosis L ankle tendonitis, nontraumatic tear of plantar fascia, deformity of toe, bunion    Referring Provider (PT) Ovid Curd, DPM    Onset Date/Surgical Date 03/12/20    Next MD Visit 08/15/20               [facility] Adult PT Treatment/Exercise - 08/07/20 0001       Knee/Hip Exercises: Stretches   Passive Hamstring Stretch Left;2 reps;30 seconds   single leg long sitting   Piriformis Stretch Left;2 reps;20 seconds   seated   Gastroc Stretch Both;Left;Right;2 reps      Knee/Hip Exercises: Aerobic   Nustep L5: 4.5 min for warm up.      Manual Therapy   Manual Therapy Soft tissue mobilization;Passive ROM;Taping    Manual therapy comments retrograde massage to Lt great toe and forefoot.    Passive ROM Lt ankle and toes (circumduction of foot with corresponding  flexion/ ext of forefoot.  Lt 2-5th toes into flex/ext.    Kinesiotex IT consultant I strip of reg rock tape applied to dorsum of Lt great toe, perpendicular strip applied across metatarsal heads, and single strip around toe - to decompress tissue, decrease swelling and increase proprioception.      Ankle Exercises: Standing   SLS 10-15 seconds each leg without UE support.      Ankle Exercises: Seated   Ankle Circles/Pumps AROM;Left;10 reps    Heel / toe Raises Both;15 reps               PT Long Term Goals - 07/23/20 4268       PT LONG TERM GOAL #1   Title The patient will be indep with HEP.    Time 6    Period Weeks    Status On-going      PT LONG TERM GOAL #2   Title The patient will improve L ankle DF from -16 degrees neutral to 0 degrees.    Baseline 10 degrees L ankle DF.    Time 6    Period Weeks    Status Achieved      PT LONG TERM GOAL #3   Title The patient will improve functional status score from 33% to > or equal to 61%.    Baseline 55%    Time 6    Period Weeks    Status On-going      PT LONG TERM GOAL #4   Title The patient will demonstrate normalized gait pattern.    Time 6    Period Weeks    Status Achieved      PT LONG TERM GOAL #5   Title The patient will improve 5 time sit<>stand to < or equal to 15 seconds.    Baseline 13.6 seconds    Time 6    Period Weeks    Status Achieved      PT LONG TERM GOAL #6   Title The patient will improve gait speed from 1.99 ft/sec to > or equal to 2.6 ft/sec.    Baseline 3.5 ft/sec    Time 6    Period Weeks    Status Achieved             Pt issued info on ktape and pool.  Pt verbalized understanding.       Plan - 08/07/20 1716     Clinical Impression Statement Pt had limited tolerance for standing exercises due to increased pain/ swelling from  increase in standing hours at work (prior to arrival).  Pt continues to be sensitive to touch at tip of Lt great toe. Pt  reported reduction of pain after retrograde massage and kinesiotape applied to area.  Pt is near meeting remaining goal; will focus on creating long term HEP for return to previous level of function.    Rehab Potential Good    PT Frequency 2x / week    PT Duration 6 weeks    PT Treatment/Interventions ADLs/Self Care Home Management;Taping;Patient/family education;Dry needling;Manual techniques;Therapeutic activities;Therapeutic exercise;Gait training;Stair training;Functional mobility training;Aquatic Therapy    PT Next Visit Plan Progress standing balance/ L single limb stance activities on firm and compliant surfaces, LE strength training for work to return to gym, progress ROM in ankle and hindfoot.  Aquatic therapy next visit.    PT Home Exercise Plan [id number]    Consulted and Agree with Plan of Care Patient             Patient will benefit from skilled therapeutic intervention in order to improve the following deficits and impairments:  Pain, Impaired flexibility, Decreased range of motion, Increased fascial restricitons, Decreased activity tolerance, Difficulty walking  Visit Diagnosis: Pain in left foot  Other symptoms and signs involving the musculoskeletal system     Problem List Patient Active Problem List   Diagnosis Date Noted   Esophageal dysmotility 09/20/2019   Regurgitation and rechewing 09/20/2019   Laryngospasms 08/24/2019   Chronic cough 08/01/2019   Snoring 08/01/2019   Allergic rhinitis due to animal (cat) (dog) hair and dander 04/19/2019   Allergic rhinitis due to pollen 04/19/2019   Chronic allergic conjunctivitis 04/19/2019   Gastro-esophageal reflux disease without esophagitis 04/19/2019   Arthritis 04/13/2019   Heart murmur 04/13/2019   Hypertrophy of uterus 09/28/2018   BMI 40.0-44.9, adult (HCC) 11/14/2017   Pes planus 10/18/2016   Prediabetes 09/09/2016   Encounter for routine gynecological examination 11/12/2014   Family history of breast  cancer 11/12/2014   Obesity, Class II, BMI 35-39.9, with comorbidity 11/12/2014   Uncomplicated asthma 10/24/2014   History of female sterilization 08/05/2013   Hypertension 10/31/2012   Mayer Camel, PTA 08/07/20 5:35 PM   [facility] [place] [address] [place], [place], [address] Phone: [phone]   Fax:  [phone]  Name: Brittney Marshall MRN: [id number] Date of Birth: Aug 05, 1975

## 2020-08-08 ENCOUNTER — Ambulatory Visit: Admitting: Physical Therapy

## 2020-08-10 IMAGING — MR MR ANKLE*L* W/O CM
6 series · 40 of 40 positions shown · non-contrast
Comparison: X-ray 04/13/2019

CLINICAL DATA: Left ankle pain for 1 month

EXAM:
MRI OF THE LEFT ANKLE WITHOUT CONTRAST
TECHNIQUE: Multiplanar, multisequence MR imaging of the ankle was performed. No
intravenous contrast was administered.

[Series 6: PD fat-sat · axial · 3.0mm · 0.55mm/px · z∈[-27,+84]mm · 7 of 35 slices shown]
[im 1/35]
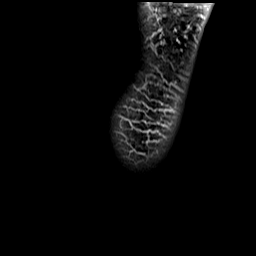
[im 6/35]
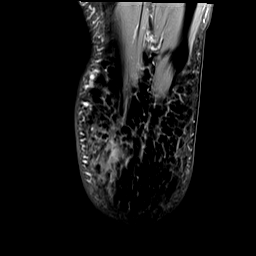
[im 12/35]
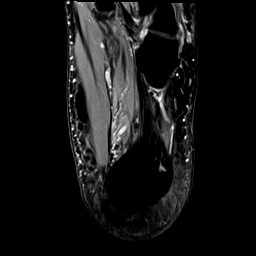
[im 18/35]
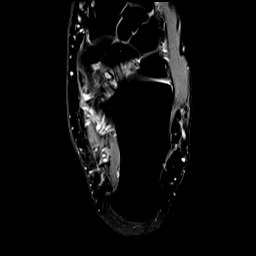
[im 23/35]
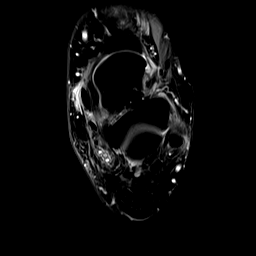
[im 29/35]
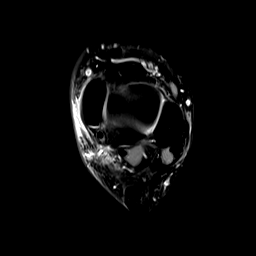
[im 35/35]
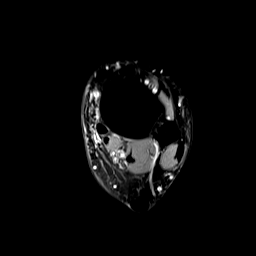

[Series 7: T2 fat-sat · axial · 3.0mm · 0.55mm/px · z∈[-27,+84]mm · 7 of 35 slices shown (1 of 2)]
[im 1/35]
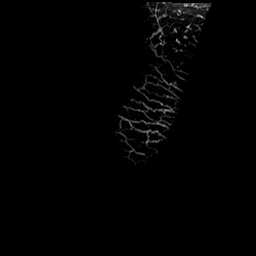
[im 6/35]
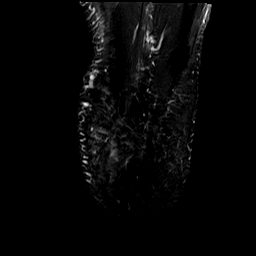
[im 12/35]
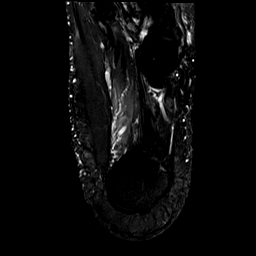
[im 18/35]
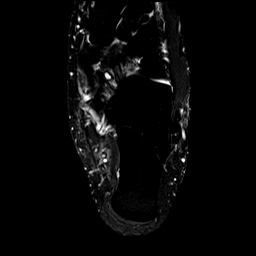
[im 23/35]
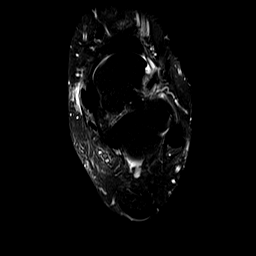
[im 29/35]
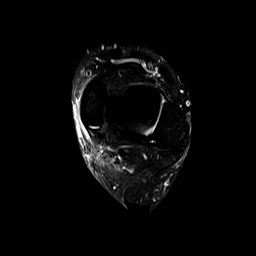
[im 35/35]
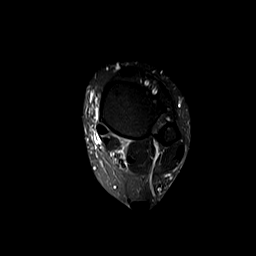

[Series 8: T1 · sagittal · 3.0mm · 0.55mm/px · 5 of 27 slices shown]
[im 1/27]
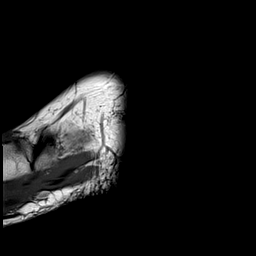
[im 7/27]
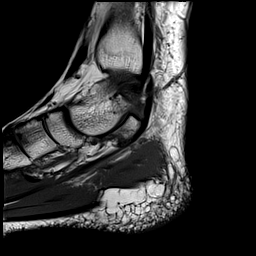
[im 14/27]
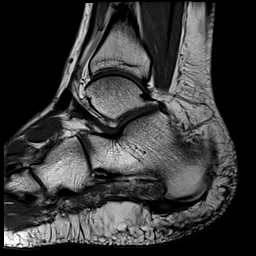
[im 20/27]
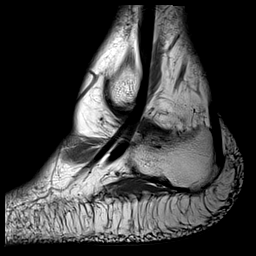
[im 27/27]
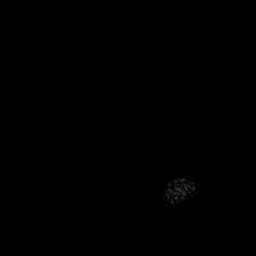

[Series 9: T2 fat-sat · coronal · 3.0mm · 0.55mm/px · 8 of 41 slices shown (2 of 2)]
[im 1/41]
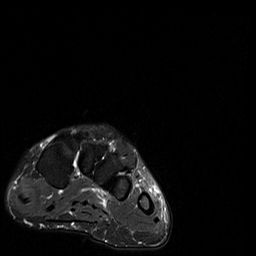
[im 6/41]
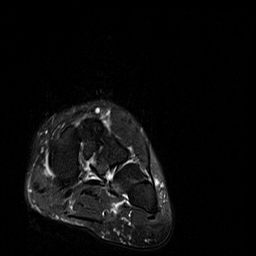
[im 12/41]
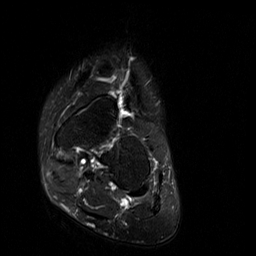
[im 18/41]
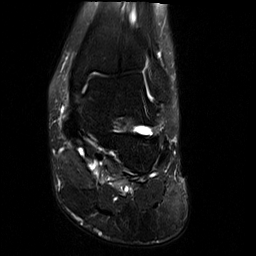
[im 23/41]
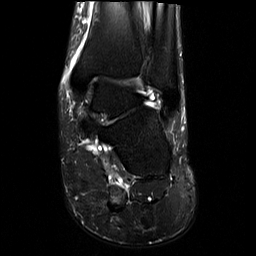
[im 29/41]
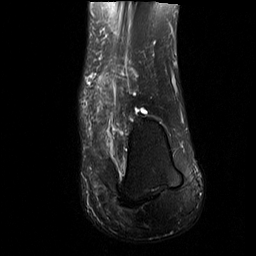
[im 35/41]
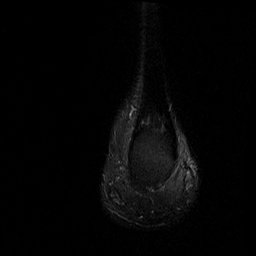
[im 41/41]
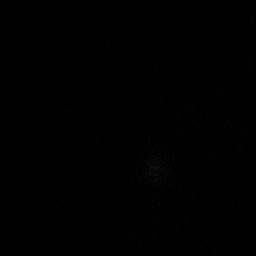

[Series 10: STIR · sagittal · 3.0mm · 0.55mm/px · 5 of 27 slices shown]
[im 1/27]
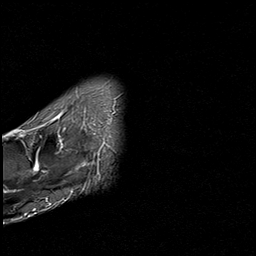
[im 7/27]
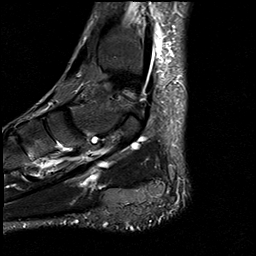
[im 14/27]
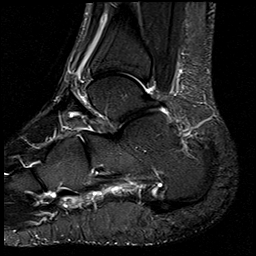
[im 20/27]
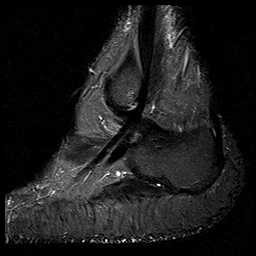
[im 27/27]
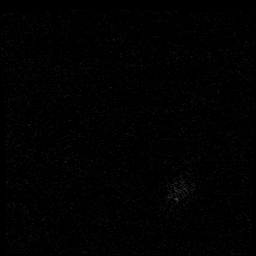

[Series 13: PD · axial · 3.0mm · 0.70mm/px · z∈[-116,+22]mm · 8 of 43 slices shown]
[im 1/43]
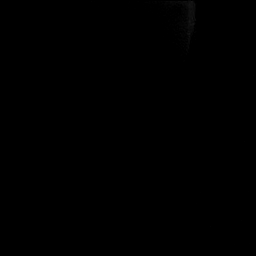
[im 7/43]
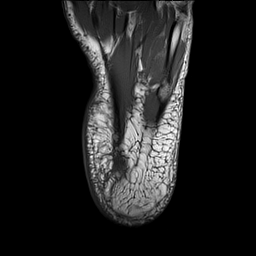
[im 13/43]
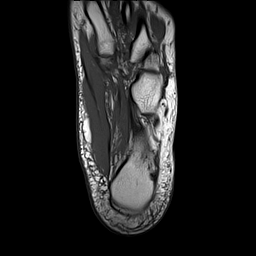
[im 19/43]
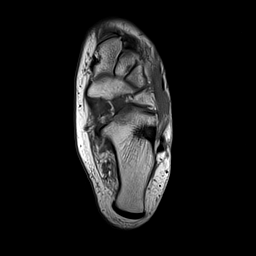
[im 25/43]
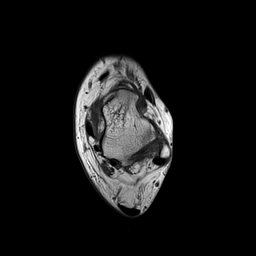
[im 31/43]
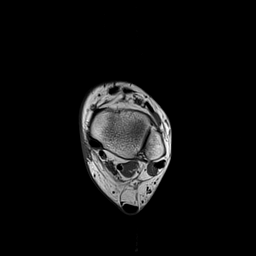
[im 37/43]
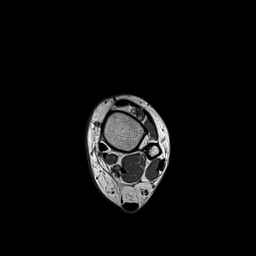
[im 43/43]
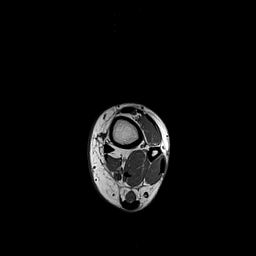

[40 of 40 positions shown; findings below may reference images not displayed]

FINDINGS: TENDONS

Peroneal: Intact peroneus longus and peroneus brevis tendons.

Posteromedial: Intact tibialis posterior, flexor hallucis longus and
flexor digitorum longus tendons.

Anterior: Intact tibialis anterior, extensor hallucis longus and
extensor digitorum longus tendons.

Achilles: Intact.

Plantar Fascia: Fusiform thickening of the proximal portion of the
central band of the plantar fascia with partial thickness
interstitial tear and mild perifascial edema (series 9, images
14-16). No full-thickness tear.

LIGAMENTS

Lateral: The anterior and posterior tibiofibular ligaments are
intact. The anterior and posterior talofibular ligaments are intact.
Intact calcaneofibular ligament.

Medial: Intact.

CARTILAGE

Ankle Joint: No joint effusion or chondral defect.

Subtalar Joints/Sinus Tarsi: No joint effusion or chondral defect.

Bones: Mild degenerative subchondral marrow signal changes noted at
the second TMT joint. Bone marrow signal is otherwise within normal
limits. No fracture. No dislocation. No suspicious bone lesion.

Other: Soft tissue edema overlies the medial malleolus.
IMPRESSION: 1. Plantar fasciitis with partial thickness tear of the central band
proximally.
2. Mild second TMT osteoarthritis.

## 2020-08-15 ENCOUNTER — Ambulatory Visit (INDEPENDENT_AMBULATORY_CARE_PROVIDER_SITE_OTHER)

## 2020-08-15 ENCOUNTER — Other Ambulatory Visit: Payer: Self-pay

## 2020-08-15 ENCOUNTER — Ambulatory Visit (INDEPENDENT_AMBULATORY_CARE_PROVIDER_SITE_OTHER): Admitting: Podiatry

## 2020-08-15 ENCOUNTER — Ambulatory Visit (INDEPENDENT_AMBULATORY_CARE_PROVIDER_SITE_OTHER): Admitting: Physical Therapy

## 2020-08-15 ENCOUNTER — Encounter: Payer: Self-pay | Admitting: Podiatry

## 2020-08-15 DIAGNOSIS — M960 Pseudarthrosis after fusion or arthrodesis: Secondary | ICD-10-CM

## 2020-08-15 DIAGNOSIS — R29898 Other symptoms and signs involving the musculoskeletal system: Secondary | ICD-10-CM | POA: Diagnosis not present

## 2020-08-15 DIAGNOSIS — M79672 Pain in left foot: Secondary | ICD-10-CM | POA: Diagnosis not present

## 2020-08-15 DIAGNOSIS — M2060 Acquired deformities of toe(s), unspecified, unspecified foot: Secondary | ICD-10-CM

## 2020-08-15 DIAGNOSIS — E559 Vitamin D deficiency, unspecified: Secondary | ICD-10-CM

## 2020-08-15 NOTE — Therapy (Signed)
[facility] [place] [address] [place], [place], [address] Phone: [phone]   Fax:  [phone]  Physical Therapy Treatment  Patient Details  Name: Brittney Marshall MRN: [id number] Date of Birth: 1975/07/23 Referring Provider (PT): Ovid Curd, DPM   Encounter Date: 08/15/2020   PT End of Session - 08/15/20 1458     Visit Number 10    Number of Visits 12    Date for PT Re-Evaluation 08/20/20    Authorization Type champ va    PT Start Time 1447    PT Stop Time 1528    PT Time Calculation (min) 41 min    Activity Tolerance Patient limited by pain    Behavior During Therapy [facility] for tasks assessed/performed             Past Medical History:  Diagnosis Date   Allergic rhinitis    GERD (gastroesophageal reflux disease)    Heart murmur    per pt had since child;  had cardiology work-up done 09/ 2020 normal ETT and echo in care everywhere, ef 55-60%,  pt denies symptoms   History of chest pain (03-11-2020  per pt denies any chest pain/ palpitations/ since   07/ 2020 ED visit @ [facility] (visit in care everywhere) complaint chest pain /palpitations, referred to cardiology;  lov in care everywhere 10-05-2018, K. Clovis Riley NP [facility], normal w/ no ishcemia ETT 10-03-2018 and echo done same day and EKG 04-06-2018 SR rate 95;  per note pt released prn   History of TMJ syndrome    Hypertension    followed by pcp   (pt had ETT 10-03-2018 normal with no ischemia, result in care everywhere)   Mild intermittent asthma    followed by pulmonology---- dr Judie Petit. Su Monks---  (03-11-2020 per pt last exacerbation early 2020);  CT chest 06-27-2019 in care everywhre   Pre-diabetes     Past Surgical History:  Procedure Laterality Date   ARTHRODESIS METATARSALPHALANGEAL JOINT (MTPJ) Right 12-15-2017  @ [facility]   DILATION AND CURETTAGE OF UTERUS  x3  last one 2004   HALLUX FUSION Left 03/12/2020   Procedure: HALLUX FUSION  METATARSALPHALANGEAL JOINT;  Surgeon: Vivi Barrack, DPM;  Location: [facility];  Service: Podiatry;  Laterality: Left;   HALLUX VALGUS LAPIDUS Left 03/12/2020   Procedure: HALLUX VALGUS LAPIDUS INCLUDING BUNIONECTOMY OR;  Surgeon: Vivi Barrack, DPM;  Location: [facility];  Service: Podiatry;  Laterality: Left;  CHOICE WITH LEG BLOCK   LAPAROSCOPIC BILATERAL SALPINGECTOMY Bilateral 09-26-2013 @ [facility] in [place]   tubal   ORIF ANKLE FRACTURE Right 2001   per pt hardware was removed    There were no vitals filed for this visit.   Subjective Assessment - 08/15/20 1450     Subjective Pt reports that she saw the surgeon. She is going to use bone stimulator until end of year. Bone is healing. She reports that the tape helped tremendously with pain and swelling.  She would like to have it applied again.  She has had accommodations at work, not getting up as much.    Pertinent History R foot bunionectomy in 2019; arthritis    Patient Stated Goals "be able to walk normally", reduce pain in hips    Currently in Pain? No/denies    Pain Score 0-No pain            Pt seen for aquatic therapy today.  Treatment took place in water 3.25-4 ft  in depth at the [facility] pool. Temp of water was 91.  Pt entered/exited the pool via stairs independently with step to pattern and bilat rail.  Treatment:   With UE support on wall:  side stepping.  Squats x 15.  Hip ext x 10, Hip abdct x 10 each leg.   With UE support on aqua jogger barbell:  forward and backward gait (3 separate trials).  high knee marching.  Forward rock (little lift off of heels)/ backward rock to Toes up x 12.   Without support:  SLS x 2 reps each leg.  Sit to/from stand on bench in water.   Tandem stance each leg forward.   Seated on pool bench:  alternating LAQ.  Ankle circles.  Hip abdct /add (bilat long sitting).   Pt requires buoyancy for support and to offload joints with  strengthening exercises. Viscosity of the water is needed for resistance of strengthening; water current perturbations provides challenge to standing balance unsupported, requiring increased core activation.     [facility] PT Assessment - 08/15/20 0001       Assessment   Medical Diagnosis L ankle tendonitis, nontraumatic tear of plantar fascia, deformity of toe, bunion    Referring Provider (PT) Ovid Curd, DPM    Onset Date/Surgical Date 03/12/20    Next MD Visit 4 wks from today.               PT Long Term Goals - 07/23/20 5498       PT LONG TERM GOAL #1   Title The patient will be indep with HEP.    Time 6    Period Weeks    Status On-going      PT LONG TERM GOAL #2   Title The patient will improve L ankle DF from -16 degrees neutral to 0 degrees.    Baseline 10 degrees L ankle DF.    Time 6    Period Weeks    Status Achieved      PT LONG TERM GOAL #3   Title The patient will improve functional status score from 33% to > or equal to 61%.    Baseline 55%    Time 6    Period Weeks    Status On-going      PT LONG TERM GOAL #4   Title The patient will demonstrate normalized gait pattern.    Time 6    Period Weeks    Status Achieved      PT LONG TERM GOAL #5   Title The patient will improve 5 time sit<>stand to < or equal to 15 seconds.    Baseline 13.6 seconds    Time 6    Period Weeks    Status Achieved      PT LONG TERM GOAL #6   Title The patient will improve gait speed from 1.99 ft/sec to > or equal to 2.6 ft/sec.    Baseline 3.5 ft/sec    Time 6    Period Weeks    Status Achieved                   Plan - 08/15/20 1509     Clinical Impression Statement Pt had positive response to ktape application last visit, with report of less pain and swelling.  Pt required minor encouragement in water (per her report she doesn't know how to swim).  she tolerated all aquatic exercises without increase in pain.  Will continue to progress LE strengthening  and ankle ROM for long term HEP.    Rehab Potential Good    PT Frequency 2x / week    PT Duration 6 weeks    PT Treatment/Interventions ADLs/Self Care Home Management;Taping;Patient/family education;Dry needling;Manual techniques;Therapeutic activities;Therapeutic exercise;Gait training;Stair training;Functional mobility training;Aquatic Therapy    PT Next Visit Plan Assess goals; progress land HEP. Teach how to tape great toe with Ktape.    PT Home Exercise Plan [id number]    Consulted and Agree with Plan of Care Patient             Patient will benefit from skilled therapeutic intervention in order to improve the following deficits and impairments:  Pain, Impaired flexibility, Decreased range of motion, Increased fascial restricitons, Decreased activity tolerance, Difficulty walking  Visit Diagnosis: Pain in left foot  Other symptoms and signs involving the musculoskeletal system     Problem List Patient Active Problem List   Diagnosis Date Noted   Esophageal dysmotility 09/20/2019   Regurgitation and rechewing 09/20/2019   Laryngospasms 08/24/2019   Chronic cough 08/01/2019   Snoring 08/01/2019   Allergic rhinitis due to animal (cat) (dog) hair and dander 04/19/2019   Allergic rhinitis due to pollen 04/19/2019   Chronic allergic conjunctivitis 04/19/2019   Gastro-esophageal reflux disease without esophagitis 04/19/2019   Arthritis 04/13/2019   Heart murmur 04/13/2019   Hypertrophy of uterus 09/28/2018   BMI 40.0-44.9, adult (HCC) 11/14/2017   Pes planus 10/18/2016   Prediabetes 09/09/2016   Encounter for routine gynecological examination 11/12/2014   Family history of breast cancer 11/12/2014   Obesity, Class II, BMI 35-39.9, with comorbidity 11/12/2014   Uncomplicated asthma 10/24/2014   History of female sterilization 08/05/2013   Hypertension 10/31/2012   Mayer Camel, PTA 08/15/20 4:46 PM   [facility]  [place] [address] [place], [place], [address] Phone: [phone]   Fax:  [phone]  Name: Brittney Marshall MRN: [id number] Date of Birth: July 06, 1975

## 2020-08-18 ENCOUNTER — Telehealth: Payer: Self-pay | Admitting: Podiatry

## 2020-08-18 ENCOUNTER — Other Ambulatory Visit: Payer: Self-pay | Admitting: Podiatry

## 2020-08-18 DIAGNOSIS — M960 Pseudarthrosis after fusion or arthrodesis: Secondary | ICD-10-CM

## 2020-08-18 NOTE — Progress Notes (Signed)
CT scan ordered

## 2020-08-18 NOTE — Telephone Encounter (Signed)
Dr Ardelle Anton is talking to the patient today. Brittney Marshall

## 2020-08-18 NOTE — Telephone Encounter (Signed)
Patient got xray results from fridays appointment and patient wanted to know if you could call her. She states that she need CT per the xray results

## 2020-08-19 ENCOUNTER — Other Ambulatory Visit: Payer: Self-pay

## 2020-08-19 ENCOUNTER — Ambulatory Visit (INDEPENDENT_AMBULATORY_CARE_PROVIDER_SITE_OTHER)

## 2020-08-19 DIAGNOSIS — M960 Pseudarthrosis after fusion or arthrodesis: Secondary | ICD-10-CM

## 2020-08-20 ENCOUNTER — Telehealth: Payer: Self-pay | Admitting: Podiatry

## 2020-08-20 ENCOUNTER — Ambulatory Visit (INDEPENDENT_AMBULATORY_CARE_PROVIDER_SITE_OTHER): Admitting: Rehabilitative and Restorative Service Providers"

## 2020-08-20 DIAGNOSIS — R29898 Other symptoms and signs involving the musculoskeletal system: Secondary | ICD-10-CM

## 2020-08-20 DIAGNOSIS — M79672 Pain in left foot: Secondary | ICD-10-CM | POA: Diagnosis not present

## 2020-08-20 NOTE — Addendum Note (Signed)
Addended by: Margretta Ditty M on: 08/20/2020 09:26 AM   Modules accepted: Orders

## 2020-08-20 NOTE — Therapy (Signed)
[place], [place], [address] Phone: [phone]   Fax:  [phone]  Physical Therapy Treatment and Renewal Summary  Patient Details  Name: Brittney Marshall MRN: [id number] Date of Birth: [birth year] Referring Provider (PT): Celesta Gentile, DPM   Encounter Date: 08/20/2020   PT End of Session - 08/20/20 0721     Visit Number 11    Number of Visits 12    Date for PT Re-Evaluation 08/20/20    Authorization Type champ va    PT Start Time 0717    PT Stop Time 0748    PT Time Calculation (min) 31 min    Activity Tolerance Patient tolerated treatment well    Behavior During Therapy [facility] for tasks assessed/performed             Past Medical History:  Diagnosis Date   Allergic rhinitis    GERD (gastroesophageal reflux disease)    Heart murmur    per pt had since child;  had cardiology work-up done 09/ 2020 normal ETT and echo in care everywhere, ef 55-60%,  pt denies symptoms   History of chest pain (03-11-2020  per pt denies any chest pain/ palpitations/ since   07/ 2020 ED visit @ [place] (visit in care everywhere) complaint chest pain /palpitations, referred to cardiology;  lov in care everywhere 10-05-2018, K. Alroy Dust NP [facility], normal w/ no ishcemia ETT 10-03-2018 and echo done same day and EKG 04-06-2018 SR rate 95;  per note pt released prn   History of TMJ syndrome    Hypertension    followed by pcp   (pt had ETT 10-03-2018 normal with no ischemia, result in care everywhere)   Mild intermittent asthma    followed by pulmonology---- dr Jerilynn Mages. Camillo Flaming---  (03-11-2020 per pt last exacerbation early 2020);  CT chest 06-27-2019 in care everywhre   Pre-diabetes     Past Surgical History:  Procedure Laterality Date   ARTHRODESIS METATARSALPHALANGEAL JOINT (MTPJ) Right 12-15-2017  @ [place]  x3  last one 2004   [place] Left 03/12/2020   Procedure:  Davis;  Surgeon: Trula Slade, DPM;  Location: [place];  Service: Podiatry;  Laterality: Left;   HALLUX VALGUS LAPIDUS Left 03/12/2020   Procedure: HALLUX VALGUS LAPIDUS INCLUDING BUNIONECTOMY OR;  Surgeon: Trula Slade, DPM;  Location: [place];  Service: Podiatry;  Laterality: Left;  CHOICE WITH LEG BLOCK   LAPAROSCOPIC BILATERAL SALPINGECTOMY Bilateral 09-26-2013 @ [facility] in [place]   tubal   ORIF ANKLE FRACTURE Right 2001   per pt hardware was removed    There were no vitals filed for this visit.   Subjective Assessment - 08/20/20 0719     Subjective The patient reports she walked 1/2 mile yesterday on an indoor track.  She is sore today.  She got accommodations at work to remain in an office and be able to elevate her foot throughout the day.  The patient reports she was sore after aquatics session.  She had recent x-ray and CT-- slowed healing.    Pertinent History R foot bunionectomy in 2019; arthritis    Patient Stated Goals "be able to walk normally", reduce pain in hips    Currently in Pain? No/denies                [facility] PT Assessment - 08/20/20 9767  Assessment   Medical Diagnosis L ankle tendonitis, nontraumatic tear of plantar fascia, deformity of toe, bunion    Referring Provider (PT) Celesta Gentile, DPM    Onset Date/Surgical Date 03/12/20                           [facility] Adult PT Treatment/Exercise - 08/20/20 5329       Ambulation/Gait   Ambulation/Gait Yes    Ambulation/Gait Assistance 7: Independent    Ambulation Distance (Feet) 200 Feet    Gait Comments *patient more cautious with gait today due to LE soreness from muscle spasm earlier this week.      Exercises   Exercises Ankle;Knee/Hip      Knee/Hip Exercises: Stretches   Active Hamstring Stretch Right;Left;1 rep;30 seconds    Gastroc Stretch Left;3 reps;20 seconds    Other Knee/Hip  Stretches hip adductor stretch standing leaning on counter top;    Other Knee/Hip Stretches supine hip adductor stretch      Knee/Hip Exercises: Aerobic   Nustep L8 x 4 minutes      Knee/Hip Exercises: Standing   Lateral Step Up Left;10 reps    Lateral Step Up Limitations with fatigue noted in hip and quad    SLS 10-15 seconds dec'ing UE support    SLS with Vectors L leg stance with vectors reaching ant/lat/posterior                         PT Long Term Goals - 08/20/20 0748       PT LONG TERM GOAL #1   Title The patient will be indep with HEP.    Time 6    Period Weeks    Status Partially Met    Target Date 08/20/20      PT LONG TERM GOAL #2   Title The patient will improve L ankle DF from -16 degrees neutral to 0 degrees.    Baseline 10 degrees L ankle DF.    Time 6    Period Weeks    Status Achieved      PT LONG TERM GOAL #3   Title The patient will improve functional status score from 33% to > or equal to 61%.    Baseline 55%    Time 6    Period Weeks    Status On-going      PT LONG TERM GOAL #4   Title The patient will demonstrate normalized gait pattern.    Time 6    Period Weeks    Status Achieved      PT LONG TERM GOAL #5   Title The patient will improve 5 time sit<>stand to < or equal to 15 seconds.    Baseline 13.6 seconds    Time 6    Period Weeks    Status Achieved      PT LONG TERM GOAL #6   Title The patient will improve gait speed from 1.99 ft/sec to > or equal to 2.6 ft/sec.    Baseline 3.5 ft/sec    Time 6    Period Weeks    Status Achieved             UPDATED LONG TERM GOALS:    PT Long Term Goals - 08/20/20 0905       PT LONG TERM GOAL #1   Title The patient will be indep with progression of HEP.    Time 6  Period Weeks    Status Revised    Target Date 10/01/20      PT LONG TERM GOAL #2   Title The patient will return to gym routine modified to meet her current functional status-- emphasizing LE  strength/stability and overall flexibility.    Time 6    Period Weeks    Status New    Target Date 10/01/20      PT LONG TERM GOAL #3   Title The patient will improve functional status score from 33% to > or equal to 61%.    Baseline 55%    Time 6    Period Weeks    Status Revised    Target Date 10/01/20      PT LONG TERM GOAL #4   Title The patient will tolerate walking 1 mile per report.    Time 6    Period Weeks    Status New    Target Date 10/01/20                Plan - 08/20/20 0726     Clinical Impression Statement The patient has partially met LTGs.  She continues with functional limitations of dec'd tolerance to ambulation, intermittent LE cramping due to compensatory patterns during gait.  She has not returned to community exercise routine.  See updated LTGs for continued PT.    Rehab Potential Good    PT Frequency 2x / week    PT Duration 6 weeks    PT Treatment/Interventions ADLs/Self Care Home Management;Taping;Patient/family education;Dry needling;Manual techniques;Therapeutic activities;Therapeutic exercise;Gait training;Stair training;Functional mobility training;Aquatic Therapy    PT Next Visit Plan Progress standing balance/ L single limb stance activities on firm and compliant surfaces, LE strength training for work to return to gym, progress ROM in ankle and hindfoot.    PT Home Exercise Plan [id number]    Consulted and Agree with Plan of Care Patient             Patient will benefit from skilled therapeutic intervention in order to improve the following deficits and impairments:  Pain, Impaired flexibility, Decreased range of motion, Increased fascial restricitons, Decreased activity tolerance, Difficulty walking  Visit Diagnosis: Pain in left foot  Other symptoms and signs involving the musculoskeletal system     Problem List Patient Active Problem List   Diagnosis Date Noted   Esophageal dysmotility 09/20/2019   Regurgitation and  rechewing 09/20/2019   Laryngospasms 08/24/2019   Chronic cough 08/01/2019   Snoring 08/01/2019   Allergic rhinitis due to animal (cat) (dog) hair and dander 04/19/2019   Allergic rhinitis due to pollen 04/19/2019   Chronic allergic conjunctivitis 04/19/2019   Gastro-esophageal reflux disease without esophagitis 04/19/2019   Arthritis 04/13/2019   Heart murmur 04/13/2019   Hypertrophy of uterus 09/28/2018   BMI 40.0-44.9, adult ([place]) 11/14/2017   Pes planus 10/18/2016   Prediabetes 09/09/2016   Encounter for routine gynecological examination 11/12/2014   Family history of breast cancer 11/12/2014   Obesity, Class II, BMI 35-39.9, with comorbidity 58/52/7782   Uncomplicated asthma 42/35/3614   History of female sterilization 08/05/2013   Hypertension 10/31/2012    Drumright, PT 08/20/2020, [place], [place], [address] Phone: [phone]   Fax:  [phone]  Name: Brittney Marshall MRN: [id number] Date of Birth: Oct 18, 1975

## 2020-08-20 NOTE — Progress Notes (Signed)
Subjective: Brittney Marshall is a 45 y.o. is seen today in office s/p Left 1st MTPJ arthrodesis preformed on 03/12/2020.  She said that she has been doing well.  She still been in physical therapy and she is back to regular shoe.  She was having some difficulty with work with no restrictions but we have provided updated restrictions for her job and she states they have been accommodating her.  She states this point with minimal and not any pain. Denies any systemic complaints such as fevers, chills, nausea, vomiting. No calf pain, chest pain, shortness of breath.   Objective: General: No acute distress, AAOx3  DP/PT pulses palpable 2/4, CRT < 3 sec to all digits.  Protective sensation intact. Motor function intact.  LEFT foot: Incision is well coapted without any evidence of dehiscence and scar is well formed.  Arthrodesis site appears to be stable.  There is minimal edema but there is no erythema or warmth.  No significant tenderness palpation on the surgical site.  No other areas of discomfort identified. No other areas of tenderness to bilateral lower extremities.  No pain with calf compression, swelling, warmth, erythema.   Assessment and Plan:  Status post left foot surgery, nonunion arthrodesis site; DVT  -Treatment options discussed including all alternatives, risks, and complications -Repeat x-rays obtained and reviewed.  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  The report was not back at the time I saw the patient.  When I saw her we discussed continuing with physical therapy, supportive shoe gear with stiffer shoes.  Continue bone stimulator as well as vitamin D supplementation. -The patient did call and we spoke about her x-ray report.  I ordered a CT scan to evaluate the nonunion.  I discussed with her symptomatic versus asymptomatic nonunion we will get the  CT scan and go from there.  Continue accommodations for work.  [name]

## 2020-08-20 NOTE — Telephone Encounter (Signed)
Patient is requesting a call back to discuss CT scan results

## 2020-08-22 ENCOUNTER — Ambulatory Visit (INDEPENDENT_AMBULATORY_CARE_PROVIDER_SITE_OTHER): Admitting: Physical Therapy

## 2020-08-22 ENCOUNTER — Other Ambulatory Visit

## 2020-08-22 ENCOUNTER — Other Ambulatory Visit: Payer: Self-pay

## 2020-08-22 DIAGNOSIS — M79672 Pain in left foot: Secondary | ICD-10-CM

## 2020-08-22 DIAGNOSIS — R29898 Other symptoms and signs involving the musculoskeletal system: Secondary | ICD-10-CM

## 2020-08-22 NOTE — Therapy (Signed)
[facility] [name] [address] [place], [place], [address] Phone: [phone]   Fax:  [phone]  Physical Therapy Treatment  Patient Details  Name: Brittney Marshall MRN: [id number] Date of Birth: 09-16-1975 Referring Provider (PT): Ovid Curd, DPM   Encounter Date: 08/22/2020   PT End of Session - 08/22/20 1452     Visit Number 12    Number of Visits 23    Date for PT Re-Evaluation 10/01/20    Authorization Type champ va    PT Start Time 1445    PT Stop Time 1530    PT Time Calculation (min) 45 min    Activity Tolerance Patient tolerated treatment well    Behavior During Therapy [facility] for tasks assessed/performed             Past Medical History:  Diagnosis Date   Allergic rhinitis    GERD (gastroesophageal reflux disease)    Heart murmur    per pt had since child;  had cardiology work-up done 09/ 2020 normal ETT and echo in care everywhere, ef 55-60%,  pt denies symptoms   History of chest pain (03-11-2020  per pt denies any chest pain/ palpitations/ since   07/ 2020 ED visit @ [facility] (visit in care everywhere) complaint chest pain /palpitations, referred to cardiology;  lov in care everywhere 10-05-2018, K. Clovis Riley NP [facility], normal w/ no ishcemia ETT 10-03-2018 and echo done same day and EKG 04-06-2018 SR rate 95;  per note pt released prn   History of TMJ syndrome    Hypertension    followed by pcp   (pt had ETT 10-03-2018 normal with no ischemia, result in care everywhere)   Mild intermittent asthma    followed by pulmonology---- dr Judie Petit. Su Monks---  (03-11-2020 per pt last exacerbation early 2020);  CT chest 06-27-2019 in care everywhre   Pre-diabetes     Past Surgical History:  Procedure Laterality Date   ARTHRODESIS METATARSALPHALANGEAL JOINT (MTPJ) Right 12-15-2017  @ [facility]   DILATION AND CURETTAGE OF UTERUS  x3  last one 2004   HALLUX FUSION Left 03/12/2020   Procedure: HALLUX FUSION  METATARSALPHALANGEAL JOINT;  Surgeon: Vivi Barrack, DPM;  Location: [facility];  Service: Podiatry;  Laterality: Left;   HALLUX VALGUS LAPIDUS Left 03/12/2020   Procedure: HALLUX VALGUS LAPIDUS INCLUDING BUNIONECTOMY OR;  Surgeon: Vivi Barrack, DPM;  Location: [facility];  Service: Podiatry;  Laterality: Left;  CHOICE WITH LEG BLOCK   LAPAROSCOPIC BILATERAL SALPINGECTOMY Bilateral 09-26-2013 @ [facility] in [place]   tubal   ORIF ANKLE FRACTURE Right 2001   per pt hardware was removed    There were no vitals filed for this visit.   Subjective Assessment - 08/22/20 1452     Subjective Pt reports she was sore in Lt toe first thing in the morning following last aquatic appt.  She started doing eliptical yesterday for 5 min; "I'm a little sore from that".    Pertinent History R foot bunionectomy in 2019; arthritis    Patient Stated Goals "be able to walk normally", reduce pain in hips    Currently in Pain? No/denies    Pain Score 0-No pain            Pt seen for aquatic therapy today.  Treatment took place in water 3.25-4 ft in depth at the [facility] pool. Temp of water was 91.  Pt entered/exited the pool via stairs with supervision  with bilat rail.  Treatment:   Holding onto wall:  gentle heel/toe raises x 10, hip abdct x 15 each leg,  hamstring curls x 15 reps. Braiding.  Wide stance bilat pivots L/R.    Holding onto aqua jogger barbell for UE support:  Forward marching, backward gait, forward gait, side stepping.  Side step with squat.   Without support:  SLS with support x 30 sec, SLS without support x 30 RLE, x 12 sec LLE.     Tandem stance each leg forward x 10 sec each withotu support.     Seated on pool bench:  LAQ with DF x 10 each leg.  Ankle circles bilat, 10 CW/10 CCW.  Hip abdct /add (bilat long sitting). Sit to/from stand on bench in water.   Pt requires buoyancy for support and to offload joints with strengthening  exercises. Viscosity of the water is needed for resistance of strengthening; water current perturbations provides challenge to standing balance unsupported, requiring increased core activation.  Ktape applied to Lt great toe (dorsum) and around toe, with no stretch, to assist in decompressing tissue and increasing proprioception.  Applied after pt was dried off from pool and dressed.     PT Long Term Goals - 08/20/20 0905       PT LONG TERM GOAL #1   Title The patient will be indep with progression of HEP.    Time 6    Period Weeks    Status Revised    Target Date 10/01/20      PT LONG TERM GOAL #2   Title The patient will return to gym routine modified to meet her current functional status-- emphasizing LE strength/stability and overall flexibility.    Time 6    Period Weeks    Status New    Target Date 10/01/20      PT LONG TERM GOAL #3   Title The patient will improve functional status score from 33% to > or equal to 61%.    Baseline 55%    Time 6    Period Weeks    Status Revised    Target Date 10/01/20      PT LONG TERM GOAL #4   Title The patient will tolerate walking 1 mile per report.    Time 6    Period Weeks    Status New    Target Date 10/01/20                   Plan - 08/22/20 1457     Clinical Impression Statement Improved comfort level in the water this visit; able to complete more exercises in less time.  She continues to require UE support, whether it is the wall or floatation device.  She tolerated all exercises well - without increase in pain.  Trial of taping Lt great toe after session to assist with reduction of post-exercise soreness after today's visit. SLS time in water improved from last attempt. Progressing towards goals.    Rehab Potential Good    PT Frequency 2x / week    PT Duration 6 weeks    PT Treatment/Interventions ADLs/Self Care Home Management;Taping;Patient/family education;Dry needling;Manual techniques;Therapeutic  activities;Therapeutic exercise;Gait training;Stair training;Functional mobility training;Aquatic Therapy    PT Next Visit Plan Progress standing balance/ L single limb stance activities on firm and compliant surfaces, LE strength training for work to return to gym, progress ROM in ankle and hindfoot.    PT Home Exercise Plan [id number]    Consulted and  Agree with Plan of Care Patient             Patient will benefit from skilled therapeutic intervention in order to improve the following deficits and impairments:  Pain, Impaired flexibility, Decreased range of motion, Increased fascial restricitons, Decreased activity tolerance, Difficulty walking  Visit Diagnosis: Pain in left foot  Other symptoms and signs involving the musculoskeletal system     Problem List Patient Active Problem List   Diagnosis Date Noted   Esophageal dysmotility 09/20/2019   Regurgitation and rechewing 09/20/2019   Laryngospasms 08/24/2019   Chronic cough 08/01/2019   Snoring 08/01/2019   Allergic rhinitis due to animal (cat) (dog) hair and dander 04/19/2019   Allergic rhinitis due to pollen 04/19/2019   Chronic allergic conjunctivitis 04/19/2019   Gastro-esophageal reflux disease without esophagitis 04/19/2019   Arthritis 04/13/2019   Heart murmur 04/13/2019   Hypertrophy of uterus 09/28/2018   BMI 40.0-44.9, adult ([facility]) 11/14/2017   Pes planus 10/18/2016   Prediabetes 09/09/2016   Encounter for routine gynecological examination 11/12/2014   Family history of breast cancer 11/12/2014   Obesity, Class II, BMI 35-39.9, with comorbidity 11/12/2014   Uncomplicated asthma 10/24/2014   History of female sterilization 08/05/2013   Hypertension 10/31/2012   Mayer Camel, PTA 08/22/20 4:37 PM   [facility] [place] [address] [place], [place], [address] Phone: [phone]   Fax:  [phone]  Name: MARISE KNAPPER MRN: [id number] Date  of Birth: 04/25/1975

## 2020-08-29 ENCOUNTER — Ambulatory Visit: Admitting: Physical Therapy

## 2020-09-03 ENCOUNTER — Ambulatory Visit (INDEPENDENT_AMBULATORY_CARE_PROVIDER_SITE_OTHER): Admitting: Rehabilitative and Restorative Service Providers"

## 2020-09-03 ENCOUNTER — Other Ambulatory Visit: Payer: Self-pay

## 2020-09-03 ENCOUNTER — Encounter: Payer: Self-pay | Admitting: Rehabilitative and Restorative Service Providers"

## 2020-09-03 DIAGNOSIS — R29898 Other symptoms and signs involving the musculoskeletal system: Secondary | ICD-10-CM | POA: Diagnosis not present

## 2020-09-03 DIAGNOSIS — M79672 Pain in left foot: Secondary | ICD-10-CM | POA: Diagnosis not present

## 2020-09-03 NOTE — Therapy (Signed)
[facility] [place] [address] [place], [place], [address] Phone: [phone]   Fax:  [phone]  Physical Therapy Treatment  Patient Details  Name: Brittney Marshall MRN: [id number] Date of Birth: August 10, 1975 Referring Provider (PT): Ovid Curd, DPM   Encounter Date: 09/03/2020   PT End of Session - 09/03/20 0721     Visit Number 13    Number of Visits 23    Date for PT Re-Evaluation 10/01/20    Authorization Type champ va    PT Start Time 0717    PT Stop Time 0755    PT Time Calculation (min) 38 min    Activity Tolerance Patient tolerated treatment well    Behavior During Therapy [facility] for tasks assessed/performed             Past Medical History:  Diagnosis Date   Allergic rhinitis    GERD (gastroesophageal reflux disease)    Heart murmur    per pt had since child;  had cardiology work-up done 09/ 2020 normal ETT and echo in care everywhere, ef 55-60%,  pt denies symptoms   History of chest pain (03-11-2020  per pt denies any chest pain/ palpitations/ since   07/ 2020 ED visit @ [facility] (visit in care everywhere) complaint chest pain /palpitations, referred to cardiology;  lov in care everywhere 10-05-2018, K. Clovis Riley NP [facility], normal w/ no ishcemia ETT 10-03-2018 and echo done same day and EKG 04-06-2018 SR rate 95;  per note pt released prn   History of TMJ syndrome    Hypertension    followed by pcp   (pt had ETT 10-03-2018 normal with no ischemia, result in care everywhere)   Mild intermittent asthma    followed by pulmonology---- dr Judie Petit. Su Monks---  (03-11-2020 per pt last exacerbation early 2020);  CT chest 06-27-2019 in care everywhre   Pre-diabetes     Past Surgical History:  Procedure Laterality Date   ARTHRODESIS METATARSALPHALANGEAL JOINT (MTPJ) Right 12-15-2017  @ [facility]   DILATION AND CURETTAGE OF UTERUS  x3  last one 2004   HALLUX FUSION Left 03/12/2020   Procedure: HALLUX FUSION  METATARSALPHALANGEAL JOINT;  Surgeon: Vivi Barrack, DPM;  Location: [facility];  Service: Podiatry;  Laterality: Left;   HALLUX VALGUS LAPIDUS Left 03/12/2020   Procedure: HALLUX VALGUS LAPIDUS INCLUDING BUNIONECTOMY OR;  Surgeon: Vivi Barrack, DPM;  Location: [facility];  Service: Podiatry;  Laterality: Left;  CHOICE WITH LEG BLOCK   LAPAROSCOPIC BILATERAL SALPINGECTOMY Bilateral 09-26-2013 @ [facility] in [place]   tubal   ORIF ANKLE FRACTURE Right 2001   per pt hardware was removed    There were no vitals filed for this visit.   Subjective Assessment - 09/03/20 0721     Subjective The patient reports she is elevating her foot at work, which is helping pain and swelling.    Pertinent History R foot bunionectomy in 2019; arthritis    Patient Stated Goals "be able to walk normally", reduce pain in hips    Currently in Pain? No/denies                [facility] PT Assessment - 09/03/20 7262       Assessment   Medical Diagnosis L ankle tendonitis, nontraumatic tear of plantar fascia, deformity of toe, bunion    Referring Provider (PT) Ovid Curd, DPM    Onset Date/Surgical Date 03/12/20  [facility] Adult PT Treatment/Exercise - 09/03/20 0722       Transfers   Transfers Floor to Transfer    Floor to Transfer 4: Min assist    Floor to Transfer Details (indicate cue type and reason) The patient has ability to position herself in 1/2 kneel, but it is challenging to stand because of LE weakness in hips/thighs.  We worked on continuing to progress strengthening with return to gym routine      Ambulation/Gait   Ambulation/Gait Yes    Ambulation/Gait Assistance 7: Independent    Ambulation Distance (Feet) 350 Feet      Exercises   Exercises Ankle;Knee/Hip      Knee/Hip Exercises: Stretches   Gastroc Stretch Right;Left;3 reps;30 seconds      Knee/Hip Exercises: Aerobic   Nustep level 5 LEs only x  4 minutes      Knee/Hip Exercises: Standing   Hip Flexion Stengthening;Right;Left;10 reps    Hip Flexion Limitations marching in place    Hip Abduction Stengthening;Right;Left    Abduction Limitations sidestepping squats x 10 reps R and L sides    Wall Squat 10 reps    SLS 10seconds    Other Standing Knee Exercises sit<>stand x 10 reps                         PT Long Term Goals - 08/20/20 0905       PT LONG TERM GOAL #1   Title The patient will be indep with progression of HEP.    Time 6    Period Weeks    Status Revised    Target Date 10/01/20      PT LONG TERM GOAL #2   Title The patient will return to gym routine modified to meet her current functional status-- emphasizing LE strength/stability and overall flexibility.    Time 6    Period Weeks    Status New    Target Date 10/01/20      PT LONG TERM GOAL #3   Title The patient will improve functional status score from 33% to > or equal to 61%.    Baseline 55%    Time 6    Period Weeks    Status Revised    Target Date 10/01/20      PT LONG TERM GOAL #4   Title The patient will tolerate walking 1 mile per report.    Time 6    Period Weeks    Status New    Target Date 10/01/20                   Plan - 09/03/20 0757     Clinical Impression Statement The patient is continuing to improve tolerance to work schedule and general household activities.  PT added 2x/week gym routine to work to return to prior community activities.  Plan to continue to progress to LTGs.    PT Treatment/Interventions ADLs/Self Care Home Management;Taping;Patient/family education;Dry needling;Manual techniques;Therapeutic activities;Therapeutic exercise;Gait training;Stair training;Functional mobility training;Aquatic Therapy    PT Next Visit Plan Progress standing balance/ L single limb stance activities on firm and compliant surfaces, LE strength training for work to return to gym, progress ROM in ankle and hindfoot.     PT Home Exercise Plan [id number]    Consulted and Agree with Plan of Care Patient             Patient will benefit from skilled therapeutic intervention in order to improve the  following deficits and impairments:     Visit Diagnosis: Pain in left foot  Other symptoms and signs involving the musculoskeletal system     Problem List Patient Active Problem List   Diagnosis Date Noted   Esophageal dysmotility 09/20/2019   Regurgitation and rechewing 09/20/2019   Laryngospasms 08/24/2019   Chronic cough 08/01/2019   Snoring 08/01/2019   Allergic rhinitis due to animal (cat) (dog) hair and dander 04/19/2019   Allergic rhinitis due to pollen 04/19/2019   Chronic allergic conjunctivitis 04/19/2019   Gastro-esophageal reflux disease without esophagitis 04/19/2019   Arthritis 04/13/2019   Heart murmur 04/13/2019   Hypertrophy of uterus 09/28/2018   BMI 40.0-44.9, adult (HCC) 11/14/2017   Pes planus 10/18/2016   Prediabetes 09/09/2016   Encounter for routine gynecological examination 11/12/2014   Family history of breast cancer 11/12/2014   Obesity, Class II, BMI 35-39.9, with comorbidity 11/12/2014   Uncomplicated asthma 10/24/2014   History of female sterilization 08/05/2013   Hypertension 10/31/2012    Chasty Randal 09/03/2020, 8:49 AM  [facility] [address] [place], [place], [address] Phone: [phone]   Fax:  [phone]  Name: JAYLI FOGLEMAN MRN: [id number] Date of Birth: 05/22/1975

## 2020-09-09 DIAGNOSIS — K760 Fatty (change of) liver, not elsewhere classified: Secondary | ICD-10-CM | POA: Insufficient documentation

## 2020-09-12 ENCOUNTER — Other Ambulatory Visit: Payer: Self-pay

## 2020-09-12 ENCOUNTER — Ambulatory Visit (INDEPENDENT_AMBULATORY_CARE_PROVIDER_SITE_OTHER): Admitting: Podiatry

## 2020-09-12 ENCOUNTER — Ambulatory Visit (INDEPENDENT_AMBULATORY_CARE_PROVIDER_SITE_OTHER): Admitting: Physical Therapy

## 2020-09-12 ENCOUNTER — Encounter: Payer: Self-pay | Admitting: Podiatry

## 2020-09-12 DIAGNOSIS — I82402 Acute embolism and thrombosis of unspecified deep veins of left lower extremity: Secondary | ICD-10-CM

## 2020-09-12 DIAGNOSIS — L603 Nail dystrophy: Secondary | ICD-10-CM

## 2020-09-12 DIAGNOSIS — M79672 Pain in left foot: Secondary | ICD-10-CM | POA: Diagnosis not present

## 2020-09-12 DIAGNOSIS — R29898 Other symptoms and signs involving the musculoskeletal system: Secondary | ICD-10-CM

## 2020-09-12 DIAGNOSIS — M960 Pseudarthrosis after fusion or arthrodesis: Secondary | ICD-10-CM | POA: Diagnosis not present

## 2020-09-12 NOTE — Progress Notes (Signed)
Subjective: Brittney Marshall is a 45 y.o. is seen today in office s/p Left 1st MTPJ arthrodesis preformed on 03/12/2020.  States that she is doing well and she continues to improve.  She is wearing a regular shoe when she is continue physical therapy.  She has occasional soreness in the top of the incision most in the morning after being on her feet all day.  She thinks this may be coming from her shoe.  She does continue to wear compression socks.  Swelling has much improved.  She has a follow-up with her primary care physician her vitamin D level has improved as well.   Has secondary concerns of her right big toenail.  Previously we had performed a a procedure on that toenail.  The nail has not been growing much.  No pain, swelling or redness or drainage.  She has no other concerns today.  Objective: General: No acute distress, AAOx3  DP/PT pulses palpable 2/4, CRT < 3 sec to all digits.  Protective sensation intact. Motor function intact.  LEFT foot: Incision is well coapted without any evidence of dehiscence and scar is well formed.  Arthrodesis site appears to be stable today.  There is mild edema but there is no erythema or warmth.  There is mild discomfort in the course of the incision.  The hardware is not prominent.  No other areas of pinpoint tenderness. RIGHT: Hallux toenails hypertrophic, dystrophic.  The proximal portion of the toenail appears to be clear but at the distal portion there is some small amount of dried blood present.  No hyperpigmentation surrounding skin either.  There is no pain, swelling redness or any drainage or signs of infection. No other areas of tenderness to bilateral lower extremities.  No pain with calf compression, swelling, warmth, erythema.   Assessment and Plan:  Status post left foot surgery, nonunion arthrodesis site; DVT  -Treatment options discussed including all alternatives, risks, and complications -We are going to hold off on further x-rays at  this point she is continue to improve.  We will continue physical therapy.  Continue with supportive shoe gear.  We discussed different types of shoes outside of her tennis shoe that she can wear.  Continue compression socks. -She is going to continue Eliquis for total of 6 months from the DVT per her primary care physician and she was directed to discontinue this. -In regards to the right hallux toenail recommended a biotin supplement.  She can also use vitamin E oil.  Return in about 6 weeks (around 10/24/2020).  [name]

## 2020-09-12 NOTE — Therapy (Signed)
[facility] [place] [address] [place], [place], [address] Phone: [phone]   Fax:  [phone]  Physical Therapy Treatment  Patient Details  Name: Brittney Marshall MRN: [id number] Date of Birth: [birth year] Referring Provider (PT): Ovid Curd, DPM   Encounter Date: 09/12/2020   PT End of Session - 09/12/20 1452     Visit Number 14    Number of Visits 23    Date for PT Re-Evaluation 10/01/20    Authorization Type champ va    PT Start Time 1445    PT Stop Time 1530    PT Time Calculation (min) 45 min    Activity Tolerance Patient tolerated treatment well    Behavior During Therapy [facility] for tasks assessed/performed             Past Medical History:  Diagnosis Date   Allergic rhinitis    GERD (gastroesophageal reflux disease)    Heart murmur    per pt had since child;  had cardiology work-up done 09/ 2020 normal ETT and echo in care everywhere, ef 55-60%,  pt denies symptoms   History of chest pain (03-11-2020  per pt denies any chest pain/ palpitations/ since   07/ 2020 ED visit @ [facility] (visit in care everywhere) complaint chest pain /palpitations, referred to cardiology;  lov in care everywhere 10-05-2018, K. Clovis Riley NP [facility], normal w/ no ishcemia ETT 10-03-2018 and echo done same day and EKG 04-06-2018 SR rate 95;  per note pt released prn   History of TMJ syndrome    Hypertension    followed by pcp   (pt had ETT 10-03-2018 normal with no ischemia, result in care everywhere)   Mild intermittent asthma    followed by pulmonology---- dr Judie Petit. Su Monks---  (03-11-2020 per pt last exacerbation early 2020);  CT chest 06-27-2019 in care everywhre   Pre-diabetes     Past Surgical History:  Procedure Laterality Date   ARTHRODESIS METATARSALPHALANGEAL JOINT (MTPJ) Right 12-15-2017  @ [facility]   DILATION AND CURETTAGE OF UTERUS  x3  last one 2004   HALLUX FUSION Left 03/12/2020   Procedure: HALLUX FUSION  METATARSALPHALANGEAL JOINT;  Surgeon: Vivi Barrack, DPM;  Location: [facility] [place];  Service: Podiatry;  Laterality: Left;   HALLUX VALGUS LAPIDUS Left 03/12/2020   Procedure: HALLUX VALGUS LAPIDUS INCLUDING BUNIONECTOMY OR;  Surgeon: Vivi Barrack, DPM;  Location: [facility];  Service: Podiatry;  Laterality: Left;  CHOICE WITH LEG BLOCK   LAPAROSCOPIC BILATERAL SALPINGECTOMY Bilateral 09-26-2013 @ [facility] in [place]   tubal   ORIF ANKLE FRACTURE Right 2001   per pt hardware was removed    There were no vitals filed for this visit.   Subjective Assessment - 09/12/20 1454     Subjective Pt reports she had follow up with surgeon today. She states he is pleased with how her gait is progressing, he'd like her to continue PT.   She states the tape helped with swelling, and stayed on one week.  She begins a wellness program on 9/2. She hasn't started back at gym yet, but states her PCP recommended starting back at 2x/wk.    Pertinent History R foot bunionectomy in 2019; arthritis    Patient Stated Goals "be able to walk normally", reduce pain in hips    Currently in Pain? No/denies    Pain Score 0-No pain  [facility] PT Assessment - 09/12/20 0001       Assessment   Medical Diagnosis L ankle tendonitis, nontraumatic tear of plantar fascia, deformity of toe, bunion    Referring Provider (PT) Ovid Curd, DPM    Onset Date/Surgical Date 03/12/20             Pt seen for aquatic therapy today.  Treatment took place in water 3.25-4 ft in depth at the [facility] pool. Temp of water was 94.  Pt entered/exited the pool via stairs independently (step to pattern) with bilat rail.  Treatment:   With UE support on pool:   forward and backward gait, side stepping.  Braiding R/ L.  Gentle heel/toe raises.  Hip abdct Lt/Rt x 10 each.   Squats x 15 reps, cues for form.   Without UE support on pool:  Tandem stance x 30 sec x 2  reps each leg forward.  SLS x 15 sec each leg x 2 reps.   Holding onto aqua jogger barbell:  High knee marching forward.   Side step with squat Rt/ Lt x 2 sets. Gait with cues to allow Lt knee to bend earlier during toe off, ensure heel strike and roll through of each foot.  Braiding with cues to relax knees and roll through feet.   Seated on bench in pool:   ankle circles CW/CCW,  LAQ with PF/ DF x 10 each leg.  Inversion, eversion.  Single leg long sitting hamstring stretch x 30 sec x 2 reps each leg.   Pt requires buoyancy for support and to offload joints with strengthening exercises. Viscosity of the water is needed for resistance of strengthening; water current perturbations provides challenge to standing balance unsupported, requiring increased core activation.   PT Long Term Goals - 08/20/20 0905       PT LONG TERM GOAL #1   Title The patient will be indep with progression of HEP.    Time 6    Period Weeks    Status Revised    Target Date 10/01/20      PT LONG TERM GOAL #2   Title The patient will return to gym routine modified to meet her current functional status-- emphasizing LE strength/stability and overall flexibility.    Time 6    Period Weeks    Status New    Target Date 10/01/20      PT LONG TERM GOAL #3   Title The patient will improve functional status score from 33% to > or equal to 61%.    Baseline 55%    Time 6    Period Weeks    Status Revised    Target Date 10/01/20      PT LONG TERM GOAL #4   Title The patient will tolerate walking 1 mile per report.    Time 6    Period Weeks    Status New    Target Date 10/01/20                   Plan - 09/12/20 1551     Clinical Impression Statement Pt's comfort in pool improving each visit; able to let go of wall / floatation device for short periods.  She reported some soreness up to 4/10 in feet after compleing braiding exercises; reduced with seated rest and change of activity.  Encouraged pt to  return to gym per conversation with MD and PT.   Plan to continue to progress towards LTGs.  PT Frequency 2x / week    PT Duration 6 weeks    PT Treatment/Interventions ADLs/Self Care Home Management;Taping;Patient/family education;Dry needling;Manual techniques;Therapeutic activities;Therapeutic exercise;Gait training;Stair training;Functional mobility training;Aquatic Therapy    PT Next Visit Plan Progress standing balance/ L single limb stance activities on firm and compliant surfaces, LE strength training for work to return to gym, progress ROM in ankle and hindfoot.    PT Home Exercise Plan [id number]    Consulted and Agree with Plan of Care Patient             Patient will benefit from skilled therapeutic intervention in order to improve the following deficits and impairments:  Pain, Impaired flexibility, Decreased range of motion, Increased fascial restricitons, Decreased activity tolerance, Difficulty walking  Visit Diagnosis: Pain in left foot  Other symptoms and signs involving the musculoskeletal system     Problem List Patient Active Problem List   Diagnosis Date Noted   Fatty liver 09/09/2020   Esophageal dysmotility 09/20/2019   Regurgitation and rechewing 09/20/2019   Laryngospasms 08/24/2019   Chronic cough 08/01/2019   Snoring 08/01/2019   Allergic rhinitis due to animal (cat) (dog) hair and dander 04/19/2019   Allergic rhinitis due to pollen 04/19/2019   Chronic allergic conjunctivitis 04/19/2019   Gastro-esophageal reflux disease without esophagitis 04/19/2019   Arthritis 04/13/2019   Heart murmur 04/13/2019   Hypertrophy of uterus 09/28/2018   BMI 40.0-44.9, adult (HCC) 11/14/2017   Pes planus 10/18/2016   Prediabetes 09/09/2016   Encounter for routine gynecological examination 11/12/2014   Family history of breast cancer 11/12/2014   Obesity, Class II, BMI 35-39.9, with comorbidity 11/12/2014   Uncomplicated asthma 10/24/2014   History of female  sterilization 08/05/2013   Hypertension 10/31/2012    Mayer Camel, PTA 09/12/20 4:00 PM   [facility] [place] [address] [place], [place], [address] Phone: [phone]   Fax:  [phone]  Name: FOYE HAGGART MRN: [id number] Date of Birth: 1975/05/27

## 2020-09-18 ENCOUNTER — Encounter: Payer: Self-pay | Admitting: Physical Therapy

## 2020-09-18 ENCOUNTER — Ambulatory Visit (INDEPENDENT_AMBULATORY_CARE_PROVIDER_SITE_OTHER): Admitting: Physical Therapy

## 2020-09-18 ENCOUNTER — Other Ambulatory Visit: Payer: Self-pay

## 2020-09-18 DIAGNOSIS — M79672 Pain in left foot: Secondary | ICD-10-CM

## 2020-09-18 DIAGNOSIS — R29898 Other symptoms and signs involving the musculoskeletal system: Secondary | ICD-10-CM

## 2020-09-18 NOTE — Therapy (Addendum)
[place], [place], [address] Phone: [phone]   Fax:  [phone]  Physical Therapy Treatment  Patient Details  Name: Brittney Marshall MRN: [id number] Date of Birth: 15-Apr-1975 Referring Provider (PT): Celesta Gentile, DPM   Encounter Date: 09/18/2020   PT End of Session - 09/18/20 1535     Visit Number 15    Number of Visits 23    Date for PT Re-Evaluation 10/01/20    Authorization Type champ va    PT Start Time 1531    PT Stop Time 1611    PT Time Calculation (min) 40 min    Activity Tolerance Patient tolerated treatment well    Behavior During Therapy [facility] for tasks assessed/performed             Past Medical History:  Diagnosis Date   Allergic rhinitis    GERD (gastroesophageal reflux disease)    Heart murmur    per pt had since child;  had cardiology work-up done 09/ 2020 normal ETT and echo in care everywhere, ef 55-60%,  pt denies symptoms   History of chest pain (03-11-2020  per pt denies any chest pain/ palpitations/ since   07/ 2020 ED visit @ [place] (visit in care everywhere) complaint chest pain /palpitations, referred to cardiology;  lov in care everywhere 10-05-2018, K. Alroy Dust NP [facility], normal w/ no ishcemia ETT 10-03-2018 and echo done same day and EKG 04-06-2018 SR rate 95;  per note pt released prn   History of TMJ syndrome    Hypertension    followed by pcp   (pt had ETT 10-03-2018 normal with no ischemia, result in care everywhere)   Mild intermittent asthma    followed by pulmonology---- dr Jerilynn Mages. Camillo Flaming---  (03-11-2020 per pt last exacerbation early 2020);  CT chest 06-27-2019 in care everywhre   Pre-diabetes     Past Surgical History:  Procedure Laterality Date   ARTHRODESIS METATARSALPHALANGEAL JOINT (MTPJ) Right 12-15-2017  @ [place]  x3  last one 2004   [place] Left 03/12/2020   Procedure: [place];  Surgeon: Trula Slade, DPM;  Location: [place];  Service: Podiatry;  Laterality: Left;   HALLUX VALGUS LAPIDUS Left 03/12/2020   Procedure: HALLUX VALGUS LAPIDUS INCLUDING BUNIONECTOMY OR;  Surgeon: Trula Slade, DPM;  Location: [place];  Service: Podiatry;  Laterality: Left;  CHOICE WITH LEG BLOCK   LAPAROSCOPIC BILATERAL SALPINGECTOMY Bilateral 09-26-2013 @ [facility] in [place]   tubal   ORIF ANKLE FRACTURE Right 2001   per pt hardware was removed    There were no vitals filed for this visit.   Subjective Assessment - 09/18/20 1535     Subjective Pt reports she has been traveling and walking a lot today so Lt toe is sore.  She has accommodations at work until December.  She states she was sore in her LEs after last aquatic session.    Patient Stated Goals "be able to walk normally", reduce pain in hips    Currently in Pain? Yes    Pain Score 8     Pain Location Toe (great toe)   Pain Orientation Left    Pain Descriptors / Indicators Sore    Aggravating Factors  prlonged time on feet    Pain Relieving Factors elevating LE  [facility] PT Assessment - 09/18/20 0001       Assessment   Medical Diagnosis L ankle tendonitis, nontraumatic tear of plantar fascia, deformity of toe, bunion    Referring Provider (PT) Celesta Gentile, DPM    Onset Date/Surgical Date 03/12/20    Next MD Visit 10/24/20             [facility] Adult PT Treatment/Exercise - 09/18/20 0001       Knee/Hip Exercises: Stretches   Passive Hamstring Stretch Left;2 reps;20 seconds      Knee/Hip Exercises: Aerobic   Elliptical L1: 1 min    Nustep L4-5: LE only x 5 min    Other Aerobic single laps around gym in between exercises, with cues for increased heel strikeLLE.      Knee/Hip Exercises: Machines for Strengthening   Cybex Leg Press 6 plates x 20 reps      Knee/Hip Exercises: Standing   Abduction Limitations side   stepping with red band on feet with UE on feet    SLS 9-10 sec each leg with foot on blue foam x 3 reps each    Other Standing Knee Exercises sit<>stand with side step at black mat x 5 each direction.  Ascending/ descending 4-6" steps with reciprocal pattern with BUE on rail x 20.      Kinesiotix   Create Space I strip of reg KT tape applied to dorsum of Lt great toe, perpendicular strip applied across metatarsal heads, and single strip around toe - to decompress tissue, decrease swelling and increase proprioception.      Ankle Exercises: Stretches   Gastroc Stretch 2 reps;20 seconds                         PT Long Term Goals - 09/18/20 1542       PT LONG TERM GOAL #1   Title The patient will be indep with progression of HEP.    Time 6    Period Weeks    Status On-going      PT LONG TERM GOAL #2   Title The patient will return to gym routine modified to meet her current functional status-- emphasizing LE strength/stability and overall flexibility.    Baseline will begin gym in beginning of Sept.    Time 6    Period Weeks    Status On-going      PT LONG TERM GOAL #3   Title The patient will improve functional status score from 33% to > or equal to 61%.    Baseline 55%    Time 6    Period Weeks    Status On-going      PT LONG TERM GOAL #4   Title The patient will tolerate walking 1 mile per report.    Time 6    Period Weeks    Status Achieved                   Plan - 09/18/20 1544     Clinical Impression Statement Pt has met LTG#4  with ability to walk 1 mile. She demonstrated improved tolerance and quality with descending 6" steps.  She tolerated all exercises well, without increase in pain.  Progressing well towards remaining goals. Anticipate d/c at last land appt.    PT Frequency 2x / week    PT Duration 6 weeks    PT Treatment/Interventions ADLs/Self Care Home Management;Taping;Patient/family education;Dry needling;Manual  techniques;Therapeutic activities;Therapeutic exercise;Gait training;Stair training;Functional mobility  training;Aquatic Therapy    PT Next Visit Plan continue d/c planning.    PT Home Exercise Plan [id number]    Consulted and Agree with Plan of Care Patient             Patient will benefit from skilled therapeutic intervention in order to improve the following deficits and impairments:  Pain, Impaired flexibility, Decreased range of motion, Increased fascial restricitons, Decreased activity tolerance, Difficulty walking  Visit Diagnosis: Pain in left foot  Other symptoms and signs involving the musculoskeletal system     Problem List Patient Active Problem List   Diagnosis Date Noted   Fatty liver 09/09/2020   Esophageal dysmotility 09/20/2019   Regurgitation and rechewing 09/20/2019   Laryngospasms 08/24/2019   Chronic cough 08/01/2019   Snoring 08/01/2019   Allergic rhinitis due to animal (cat) (dog) hair and dander 04/19/2019   Allergic rhinitis due to pollen 04/19/2019   Chronic allergic conjunctivitis 04/19/2019   Gastro-esophageal reflux disease without esophagitis 04/19/2019   Arthritis 04/13/2019   Heart murmur 04/13/2019   Hypertrophy of uterus 09/28/2018   BMI 40.0-44.9, adult ([place]) 11/14/2017   Pes planus 10/18/2016   Prediabetes 09/09/2016   Encounter for routine gynecological examination 11/12/2014   Family history of breast cancer 11/12/2014   Obesity, Class II, BMI 35-39.9, with comorbidity 39/01/2582   Uncomplicated asthma 46/21/9471   History of female sterilization 08/05/2013   Hypertension 10/31/2012   Kerin Perna, PTA 09/18/20 4:16 PM   [facility] [place], [place], [address] Phone: [phone]   Fax:  [phone]  Name: MILEA KLINK MRN: [id number] Date of Birth: [birth year]

## 2020-09-26 ENCOUNTER — Ambulatory Visit: Admitting: Physical Therapy

## 2020-10-01 ENCOUNTER — Encounter: Admitting: Rehabilitative and Restorative Service Providers"

## 2020-10-08 ENCOUNTER — Ambulatory Visit (INDEPENDENT_AMBULATORY_CARE_PROVIDER_SITE_OTHER): Admitting: Rehabilitative and Restorative Service Providers"

## 2020-10-08 ENCOUNTER — Other Ambulatory Visit: Payer: Self-pay

## 2020-10-08 DIAGNOSIS — R29898 Other symptoms and signs involving the musculoskeletal system: Secondary | ICD-10-CM

## 2020-10-08 DIAGNOSIS — M79672 Pain in left foot: Secondary | ICD-10-CM

## 2020-10-08 NOTE — Therapy (Signed)
[place], [place], [address] Phone: [phone]   Fax:  [phone]  Physical Therapy Treatment, Renewal and Discharge  Patient Details  Name: Brittney Marshall MRN: [id number] Date of Birth: Oct 16, 1975 Referring Provider (PT): Celesta Gentile, DPM   PHYSICAL THERAPY DISCHARGE SUMMARY  Visits from Start of Care: 16  Current functional level related to goals / functional outcomes: See below   Remaining deficits: Swelling at end of the day Intermittent pain   Education / Equipment: HEP, progression to community exercise/wellness   Patient agrees to discharge. Patient goals were met. Patient is being discharged due to meeting the stated rehab goals.  Encounter Date: 10/08/2020   PT End of Session - 10/08/20 0732     Visit Number 16    Number of Visits 23    Date for PT Re-Evaluation 10/01/20    Authorization Type champ va    PT Start Time 0728    PT Stop Time 0800    PT Time Calculation (min) 32 min    Activity Tolerance Patient tolerated treatment well    Behavior During Therapy [facility] for tasks assessed/performed             Past Medical History:  Diagnosis Date   Allergic rhinitis    GERD (gastroesophageal reflux disease)    Heart murmur    per pt had since child;  had cardiology work-up done 09/ 2020 normal ETT and echo in care everywhere, ef 55-60%,  pt denies symptoms   History of chest pain (03-11-2020  per pt denies any chest pain/ palpitations/ since   07/ 2020 ED visit @ [place] (visit in care everywhere) complaint chest pain /palpitations, referred to cardiology;  lov in care everywhere 10-05-2018, K. Alroy Dust NP [facility], normal w/ no ishcemia ETT 10-03-2018 and echo done same day and EKG 04-06-2018 SR rate 95;  per note pt released prn   History of TMJ syndrome    Hypertension    followed by pcp   (pt had ETT 10-03-2018 normal with no ischemia, result in care  everywhere)   Mild intermittent asthma    followed by pulmonology---- dr Jerilynn Mages. Camillo Flaming---  (03-11-2020 per pt last exacerbation early 2020);  CT chest 06-27-2019 in care everywhre   Pre-diabetes     Past Surgical History:  Procedure Laterality Date   ARTHRODESIS METATARSALPHALANGEAL JOINT (MTPJ) Right 12-15-2017  @ [place]  x3  last one 2004   [place] Left 03/12/2020   Procedure: [place];  Surgeon: Trula Slade, DPM;  Location: [place];  Service: Podiatry;  Laterality: Left;   HALLUX VALGUS LAPIDUS Left 03/12/2020   Procedure: HALLUX VALGUS LAPIDUS INCLUDING BUNIONECTOMY OR;  Surgeon: Trula Slade, DPM;  Location: [place];  Service: Podiatry;  Laterality: Left;  CHOICE WITH LEG BLOCK   LAPAROSCOPIC BILATERAL SALPINGECTOMY Bilateral 09-26-2013 @ [facility] in [place]   tubal   ORIF ANKLE FRACTURE Right 2001   per pt hardware was removed    There were no vitals filed for this visit.   Subjective Assessment - 10/08/20 0731     Subjective The patient is continuing to swell throughout hte day.  She is walking and has done up to 7,000 steps/day.  The patient has a "movement consult" with [employer] program through [place].  Her family cancelled their [employer] and will join somewhere else as she continues  in her fitness/weight loss journey.  She is still struggling some with the stairs.    Pertinent History R foot bunionectomy in 2019; arthritis    Patient Stated Goals "be able to walk normally", reduce pain in hips    Currently in Pain? No/denies                [facility] PT Assessment - 10/08/20 1548       Assessment   Medical Diagnosis L ankle tendonitis, nontraumatic tear of plantar fascia, deformity of toe, bunion    Referring Provider (PT) Celesta Gentile, DPM    Onset Date/Surgical Date 03/12/20                           [facility] Adult PT  Treatment/Exercise - 10/08/20 1548       Ambulation/Gait   Ambulation/Gait Yes    Ambulation/Gait Assistance 7: Independent    Ambulation Distance (Feet) 350 Feet    Stairs Yes    Stairs Assistance 6: Modified independent (Device/Increase time)    Stair Management Technique One rail Right;Alternating pattern    Number of Stairs 14      Self-Care   Self-Care Other Self-Care Comments    Other Self-Care Comments  discussed Post d/c plan from PT:  Patient not planning to continue pool due to changing membership to start a one on one training program.  The patient is continuing HEP and progressing home walking within tolerance.  PT recommended using swelling/pain as her guide with community exercise starting slow and slowly progressing.      Therapeutic Activites    Therapeutic Activities Other Therapeutic Activities    Other Therapeutic Activities floor<>stand working on technique and placement of the L foot      Exercises   Exercises Ankle      Ankle Exercises: Standing   SLS single leg stance R and L sides                      PT Long Term Goals - 10/08/20 0732       PT LONG TERM GOAL #1   Title The patient will be indep with progression of HEP.    Time 6    Period Weeks    Status Achieved      PT LONG TERM GOAL #2   Title The patient will return to gym routine modified to meet her current functional status-- emphasizing LE strength/stability and overall flexibility.    Baseline will begin gym in beginning of Sept.    Time 6    Period Weeks    Status Achieved      PT LONG TERM GOAL #3   Title The patient will improve functional status score from 33% to > or equal to 61%.    Baseline 65%    Time 6    Period Weeks    Status Achieved      PT LONG TERM GOAL #4   Title The patient will tolerate walking 1 mile per report.    Time 6    Period Weeks    Status Achieved                   Plan - 10/08/20 1546     Clinical Impression Statement The  patient has met all LTGs.  PT and patient discussed aquatics-- she has held on her [employer] as she is beginning a fitness program with a one on  one coach.  The patient and PT reviewed stair technique today, some HEP, and how to move floor<>stand.  She is continuing current HEP.  Patient to continue with community wellness program.    PT Frequency 1x / week    PT Treatment/Interventions ADLs/Self Care Home Management;Taping;Patient/family education;Dry needling;Manual techniques;Therapeutic activities;Therapeutic exercise;Gait training;Stair training;Functional mobility training;Aquatic Therapy    PT Next Visit Plan discharge today    PT Home Exercise Plan [id number]    Consulted and Agree with Plan of Care Patient             Patient will benefit from skilled therapeutic intervention in order to improve the following deficits and impairments:  Pain, Impaired flexibility, Decreased range of motion, Increased fascial restricitons, Decreased activity tolerance, Difficulty walking  Visit Diagnosis: Pain in left foot  Other symptoms and signs involving the musculoskeletal system     Problem List Patient Active Problem List   Diagnosis Date Noted   Fatty liver 09/09/2020   Esophageal dysmotility 09/20/2019   Regurgitation and rechewing 09/20/2019   Laryngospasms 08/24/2019   Chronic cough 08/01/2019   Snoring 08/01/2019   Allergic rhinitis due to animal (cat) (dog) hair and dander 04/19/2019   Allergic rhinitis due to pollen 04/19/2019   Chronic allergic conjunctivitis 04/19/2019   Gastro-esophageal reflux disease without esophagitis 04/19/2019   Arthritis 04/13/2019   Heart murmur 04/13/2019   Hypertrophy of uterus 09/28/2018   BMI 40.0-44.9, adult ([place]) 11/14/2017   Pes planus 10/18/2016   Prediabetes 09/09/2016   Encounter for routine gynecological examination 11/12/2014   Family history of breast cancer 11/12/2014   Obesity, Class II, BMI 35-39.9, with comorbidity  23/02/7207   Uncomplicated asthma 10/68/1661   History of female sterilization 08/05/2013   Hypertension 10/31/2012    [place], PT 10/08/2020, 3:52 PM  [facility] [place], [place], [address] Phone: [phone]   Fax:  [phone]  Name: QUETZALY EBNER MRN: [id number] Date of Birth: [birth year]

## 2020-10-10 ENCOUNTER — Encounter: Admitting: Physical Therapy

## 2020-10-17 ENCOUNTER — Encounter: Admitting: Physical Therapy

## 2020-10-21 ENCOUNTER — Telehealth: Payer: Self-pay | Admitting: Podiatry

## 2020-10-21 NOTE — Telephone Encounter (Signed)
Patient called the office wanting to know if the order for her xray of her left foot could be put in so she can do her xray before her appointment.

## 2020-10-22 ENCOUNTER — Other Ambulatory Visit: Payer: Self-pay | Admitting: Podiatry

## 2020-10-24 ENCOUNTER — Ambulatory Visit (INDEPENDENT_AMBULATORY_CARE_PROVIDER_SITE_OTHER)

## 2020-10-24 ENCOUNTER — Other Ambulatory Visit: Payer: Self-pay

## 2020-10-24 ENCOUNTER — Ambulatory Visit (INDEPENDENT_AMBULATORY_CARE_PROVIDER_SITE_OTHER): Admitting: Podiatry

## 2020-10-24 ENCOUNTER — Encounter: Payer: Self-pay | Admitting: Podiatry

## 2020-10-24 DIAGNOSIS — M2060 Acquired deformities of toe(s), unspecified, unspecified foot: Secondary | ICD-10-CM

## 2020-10-24 DIAGNOSIS — M21619 Bunion of unspecified foot: Secondary | ICD-10-CM | POA: Diagnosis not present

## 2020-10-24 DIAGNOSIS — M79672 Pain in left foot: Secondary | ICD-10-CM | POA: Diagnosis not present

## 2020-10-24 DIAGNOSIS — M960 Pseudarthrosis after fusion or arthrodesis: Secondary | ICD-10-CM

## 2020-10-24 DIAGNOSIS — E559 Vitamin D deficiency, unspecified: Secondary | ICD-10-CM | POA: Diagnosis not present

## 2020-10-24 NOTE — Progress Notes (Signed)
Subjective: Brittney Marshall is a 45 y.o. is seen today in office s/p Left 1st MTPJ arthrodesis preformed on 03/12/2020.  States that she is doing well and she continues to improve.  States that she has been discharged in physical therapy as the goals are met.  She started an exercise program.  She is wearing regular shoe and has been doing daily activities.  She gets swelling at the end of the day but when she elevates it it goes away.  She gets occasional mild soreness but not every day.  No recent injury.  No other concerns today.  She is asking for a prescription for compression socks.  Objective: General: No acute distress, AAOx3  DP/PT pulses palpable 2/4, CRT < 3 sec to all digits.  Protective sensation intact. Motor function intact.  LEFT foot: Incision is well coapted without any evidence of dehiscence and scar is well formed.  There is faint edema present to the surgical site.  There is no erythema or warmth.  Arthrodesis site appears to be stable.  There is no other areas of discomfort to the left foot or ankle. No other areas of tenderness to bilateral lower extremities.  No pain with calf compression, swelling, warmth, erythema.   Assessment and Plan:  Status post left foot surgery, nonunion arthrodesis site; DVT  -Treatment options discussed including all alternatives, risks, and complications -X-rays obtained and reviewed.  Hardware intact any complicating factors.  There is maybe some increased consolidation of the arthrodesis site await radiology report. -At this point arthrodesis site is stable.  I want her to continue with gradual increase activity level.  Prescription for knee-high compression socks provided to the patient at her request.  Continue ice elevate to the end of the day.  At this point she seems to be doing well and I do not think that a revision of the arthrodesis would be warranted.  RTC 2 months  Trula Slade DPM

## 2020-11-07 ENCOUNTER — Telehealth: Payer: Self-pay | Admitting: Podiatry

## 2020-11-07 ENCOUNTER — Other Ambulatory Visit: Payer: Self-pay | Admitting: Podiatry

## 2020-11-07 MED ORDER — IBUPROFEN 800 MG PO TABS: 800.0000 mg | ORAL_TABLET | Freq: Three times a day (TID) | ORAL | 0 refills | Status: DC | PRN

## 2020-11-07 NOTE — Telephone Encounter (Signed)
Patient has been having severe pain in the left foot. Patient is requesting pain meds. She has had to decrease her exercise being that it has been painful.  Patient is requesting a call

## 2020-11-21 ENCOUNTER — Other Ambulatory Visit: Payer: Self-pay

## 2020-11-21 ENCOUNTER — Emergency Department: Admission: EM | Admit: 2020-11-21 | Discharge: 2020-11-21 | Source: Home / Self Care

## 2020-12-04 ENCOUNTER — Telehealth: Payer: Self-pay | Admitting: Podiatry

## 2020-12-04 NOTE — Telephone Encounter (Signed)
Patient called and stated that she wanted to speak to Dr. Ardelle Anton about her left foot that she had sx on.

## 2020-12-05 ENCOUNTER — Other Ambulatory Visit: Payer: Self-pay | Admitting: Podiatry

## 2020-12-05 ENCOUNTER — Telehealth: Payer: Self-pay | Admitting: Podiatry

## 2020-12-05 MED ORDER — IBUPROFEN 800 MG PO TABS: 800.0000 mg | ORAL_TABLET | Freq: Three times a day (TID) | ORAL | 0 refills | Status: DC | PRN

## 2020-12-05 NOTE — Telephone Encounter (Signed)
Patient called office this morning requesting a call from you regarding the foot she had surgery on. Thanks

## 2020-12-16 ENCOUNTER — Encounter: Payer: Self-pay | Admitting: Podiatry

## 2020-12-16 ENCOUNTER — Telehealth: Payer: Self-pay | Admitting: Podiatry

## 2020-12-16 NOTE — Telephone Encounter (Signed)
Patient sent mychart message asking for a call. I have called the patient, no answer. Left VM to call back.

## 2020-12-19 ENCOUNTER — Other Ambulatory Visit: Payer: Self-pay

## 2020-12-19 ENCOUNTER — Ambulatory Visit (INDEPENDENT_AMBULATORY_CARE_PROVIDER_SITE_OTHER): Admitting: Podiatry

## 2020-12-19 ENCOUNTER — Ambulatory Visit (INDEPENDENT_AMBULATORY_CARE_PROVIDER_SITE_OTHER)

## 2020-12-19 DIAGNOSIS — M960 Pseudarthrosis after fusion or arthrodesis: Secondary | ICD-10-CM | POA: Diagnosis not present

## 2020-12-19 DIAGNOSIS — M7752 Other enthesopathy of left foot: Secondary | ICD-10-CM

## 2020-12-19 DIAGNOSIS — M79672 Pain in left foot: Secondary | ICD-10-CM | POA: Diagnosis not present

## 2020-12-19 MED ORDER — IBUPROFEN 800 MG PO TABS: 800.0000 mg | ORAL_TABLET | Freq: Three times a day (TID) | ORAL | 0 refills | Status: DC | PRN

## 2020-12-22 ENCOUNTER — Encounter: Payer: Self-pay | Admitting: Podiatry

## 2020-12-22 ENCOUNTER — Telehealth: Payer: Self-pay | Admitting: *Deleted

## 2020-12-22 NOTE — Progress Notes (Signed)
Subjective: Brittney Marshall is a 45 y.o. is seen today in office s/p Left 1st MTPJ arthrodesis preformed on 03/12/2020.  She states that she has had some discomfort and she is asking for restrictions to be extended and additional 3 months.  She denies any recent injury.  She also asking for physical therapy to be extended once a week at this was very helpful for her as well.  She is back to wearing her tennis shoes which has been helpful.  Prior to that she was wearing clogs which aggravates her symptoms.  No injuries that she reports.  She states that she had similar issues after her right foot surgery as well.  Objective: General: No acute distress, AAOx3  DP/PT pulses palpable 2/4, CRT < 3 sec to all digits.  Protective sensation intact. Motor function intact.  LEFT foot: Incision is well coapted without any evidence of dehiscence and scar is well formed.  There is trace edema present but there is no erythema or warmth.  There is mild tenderness palpation on surgical site today.  There is no other areas of discomfort.  Arthrodesis site appears to be stable. No other areas of tenderness to bilateral lower extremities.  No pain with calf compression, swelling, warmth, erythema.   Assessment and Plan:  Status post left foot surgery, nonunion arthrodesis site; DVT  -Treatment options discussed including all alternatives, risks, and complications -X-rays obtained and reviewed.  Hardware intact any complicating factors.  On the AP view there appears to be increased consolidation but however on the lateral view appears at the inferior portion is unchanged. -Given her continuation of symptoms and still having to be on restrictions at work on a repeat CT scan to see there is any further evidence of healing.  This is for potential surgical planning.  Continue with bone stimulator.  Continue with visible therapy once a week and a new referral was placed. -We will extend her restrictions.  [name]

## 2020-12-22 NOTE — Telephone Encounter (Signed)
Called [facility] in [place] received CT order and are processing the request after Prior authorization.

## 2020-12-24 NOTE — Telephone Encounter (Signed)
Called and spoke with Mardella Layman at [place], for prior authorization per [facility] prior required,,patient is active since 12/24/16.   Returned the call to [employer] and gave information and they will call patient to schedule.

## 2021-01-01 ENCOUNTER — Telehealth: Payer: Self-pay | Admitting: *Deleted

## 2021-01-01 NOTE — Telephone Encounter (Signed)
-----   Message from Vivi Barrack, DPM sent at 12/22/2020  7:45 AM EST ----- I have ordered a CT of the left foot if someone can please follow-up on this?  Thank you.

## 2021-01-01 NOTE — Telephone Encounter (Signed)
Called Imaging at [facility] in [place], patient has been schedule 01/08/21 per patient's choice.

## 2021-01-08 ENCOUNTER — Ambulatory Visit (INDEPENDENT_AMBULATORY_CARE_PROVIDER_SITE_OTHER)

## 2021-01-08 ENCOUNTER — Other Ambulatory Visit: Payer: Self-pay

## 2021-01-08 DIAGNOSIS — M960 Pseudarthrosis after fusion or arthrodesis: Secondary | ICD-10-CM

## 2021-01-09 ENCOUNTER — Encounter: Payer: Self-pay | Admitting: Podiatry

## 2021-01-09 NOTE — Telephone Encounter (Signed)
I called the patient to go over the CT scan. She states the foot does feel better than it did before surgery. She is taking ibuprofen prn but has decreased the amount she is taking. She is able to work and do activities and not take any medication at times. She  has been doing some better compared to when I last saw her.   She is to start PT next week.   We discussed further surgical intervention vs conservative treatment. We will further discuss at her appointment she has scheduled. She is going to continue with the bone stimulator for now.   FMLA expires Feb 2023. We will likely need to extend this.   We spent 9 minutes on the phone.

## 2021-01-15 ENCOUNTER — Ambulatory Visit: Admitting: Physical Therapy

## 2021-01-30 ENCOUNTER — Other Ambulatory Visit: Payer: Self-pay

## 2021-01-30 ENCOUNTER — Ambulatory Visit (INDEPENDENT_AMBULATORY_CARE_PROVIDER_SITE_OTHER): Admitting: Podiatry

## 2021-01-30 DIAGNOSIS — M216X1 Other acquired deformities of right foot: Secondary | ICD-10-CM

## 2021-01-30 DIAGNOSIS — M960 Pseudarthrosis after fusion or arthrodesis: Secondary | ICD-10-CM

## 2021-02-02 NOTE — Progress Notes (Signed)
Subjective: Brittney Marshall is a 46 y.o. is seen today in office s/p Left 1st MTPJ arthrodesis preformed on 03/12/2020.  She previously had a CT scan performed which still revealed nonunion.  She states that she has been having some discomfort mostly after being on her feet all day with that when she gets a throbbing and some swelling present.  No significant pain today.  She states that she takes ibuprofen about 3 times a week which before she was taking it every day.  She states that she started to have some swelling to the big toe joint after walking 2000-3000 steps a day.    She is asking for another opinion on the last CT scan to see if there is a percentage of healing.   She is noticing new callus on the right foot submetatarsal 1 and she feels that there is a prominent bone.  This been ongoing for some time but seems to be getting worse.  No recent injury or changes otherwise.  No other concerns.  Objective: General: No acute distress, AAOx3  DP/PT pulses palpable 2/4, CRT < 3 sec to all digits.  Protective sensation intact. Motor function intact.  LEFT foot: Incision is well coapted without any evidence of dehiscence and scar is well formed.  On today's exam unable to elicit any area of tenderness.  There is trace edema.  There is no erythema or warmth.  Arthrodesis site appears to be clinically stable. No other areas of tenderness to bilateral lower extremities.  No pain with calf compression, swelling, warmth, erythema.   Assessment and Plan:  Status post left foot surgery, nonunion arthrodesis site; DVT; right submetatarsal 1 callus  -Treatment options discussed including all alternatives, risks, and complications -Again reviewed the CT with her.  She is asking about another opinion I will send the CT for overread. -We did discuss return to operating for revision versus continued conservative care.  She does not want to proceed with surgery at this point.  She was taken to the bone  stimulator.  We will also await the overread on the CT scan. -We will plan on extending her restrictions for work.  She states that the [place] does not for 1 year at a time and except to expire on 28 2023.  We will go ahead and extend this for her as well.  In regards to the recent accommodations would not get up and ended on that for now until we get further information. -I do think orthotics may be beneficial particularly the callus on the right foot submetatarsal 1.  I will have her follow-up with our orthotist,  Arlys John for this.   35 minutes were sent with the patient and greater than 50% of time was in face-to-face contact   [name]

## 2021-02-03 ENCOUNTER — Telehealth: Payer: Self-pay | Admitting: Podiatry

## 2021-02-03 ENCOUNTER — Encounter: Payer: Self-pay | Admitting: Podiatry

## 2021-02-03 NOTE — Telephone Encounter (Signed)
[facility] called in response to a overread for Brittney Marshall, She was not done at [facility] she was done at [facility] for her overread.

## 2021-02-05 ENCOUNTER — Ambulatory Visit: Admitting: Physical Therapy

## 2021-02-09 ENCOUNTER — Encounter: Payer: Self-pay | Admitting: Podiatry

## 2021-02-10 ENCOUNTER — Encounter: Payer: Self-pay | Admitting: Podiatry

## 2021-02-10 ENCOUNTER — Ambulatory Visit

## 2021-02-10 DIAGNOSIS — M7752 Other enthesopathy of left foot: Secondary | ICD-10-CM

## 2021-02-10 DIAGNOSIS — M216X1 Other acquired deformities of right foot: Secondary | ICD-10-CM | POA: Diagnosis not present

## 2021-02-10 NOTE — Progress Notes (Signed)
SITUATION Reason for Consult: Evaluation for Bilateral Custom Foot Orthoses Patient / Caregiver Report: Patient has bilateral flat feet and needs support  OBJECTIVE DATA: Patient History / Diagnosis:    ICD-10-CM   1. Tendonitis of ankle, left  M77.52       Current or Previous Devices: None and no history  Foot Examination: Skin presentation:   Intact Ulcers & Callousing:   None and no history Toe / Foot Deformities:  Planus Weight Bearing Presentation:  Planus Sensation:    Intact  ORTHOTIC RECOMMENDATION Recommended Device: 1x pair of custom functional foot orthotics  Shoe size: 1M  GOALS OF ORTHOSES - Reduce Pain - Prevent Foot Deformity - Prevent Progression of Further Foot Deformity - Relieve Pressure - Improve the Overall Biomechanical Function of the Foot and Lower Extremity.  ACTIONS PERFORMED Patient was casted for Foot Orthoses via crush box. Procedure was explained and patient tolerated procedure well. All questions were answered and concerns addressed.  PLAN Potential out of pocket cost was communicated to patient. Casts are to be sent to [facility] for fabrication. Patient is to be called for fitting when devices are ready.

## 2021-02-12 ENCOUNTER — Ambulatory Visit: Attending: Podiatry | Admitting: Physical Therapy

## 2021-02-12 ENCOUNTER — Other Ambulatory Visit: Payer: Self-pay

## 2021-02-12 DIAGNOSIS — R2689 Other abnormalities of gait and mobility: Secondary | ICD-10-CM | POA: Diagnosis not present

## 2021-02-12 DIAGNOSIS — M7752 Other enthesopathy of left foot: Secondary | ICD-10-CM | POA: Insufficient documentation

## 2021-02-12 DIAGNOSIS — R262 Difficulty in walking, not elsewhere classified: Secondary | ICD-10-CM | POA: Insufficient documentation

## 2021-02-12 DIAGNOSIS — M960 Pseudarthrosis after fusion or arthrodesis: Secondary | ICD-10-CM | POA: Diagnosis not present

## 2021-02-12 DIAGNOSIS — M6281 Muscle weakness (generalized): Secondary | ICD-10-CM | POA: Insufficient documentation

## 2021-02-12 DIAGNOSIS — R29898 Other symptoms and signs involving the musculoskeletal system: Secondary | ICD-10-CM | POA: Diagnosis not present

## 2021-02-12 DIAGNOSIS — M79672 Pain in left foot: Secondary | ICD-10-CM | POA: Insufficient documentation

## 2021-02-12 NOTE — Therapy (Signed)
[facility] [address] [place], [place], [address] Phone: [phone]   Fax:  [phone]  Physical Therapy Evaluation  Patient Details  Name: Brittney Marshall MRN: [id number] Date of Birth: Oct 15, 1975 Referring Provider (PT): Vivi Barrack, [place]   Encounter Date: 02/12/2021   PT End of Session - 02/12/21 1734     Visit Number 1    Number of Visits 6    Date for PT Re-Evaluation 03/26/21    Authorization Type [employer]    PT Start Time 1650    PT Stop Time 1730    PT Time Calculation (min) 40 min    Activity Tolerance Patient tolerated treatment well    Behavior During Therapy [facility] for tasks assessed/performed             Past Medical History:  Diagnosis Date   Allergic rhinitis    GERD (gastroesophageal reflux disease)    Heart murmur    per pt had since child;  had cardiology work-up done 09/ 2020 normal ETT and echo in care everywhere, ef 55-60%,  pt denies symptoms   History of chest pain (03-11-2020  per pt denies any chest pain/ palpitations/ since   07/ 2020 ED visit @ [facility] (visit in care everywhere) complaint chest pain /palpitations, referred to cardiology;  lov in care everywhere 10-05-2018, K. Clovis Riley NP [facility], normal w/ no ishcemia ETT 10-03-2018 and echo done same day and EKG 04-06-2018 SR rate 95;  per note pt released prn   History of TMJ syndrome    Hypertension    followed by pcp   (pt had ETT 10-03-2018 normal with no ischemia, result in care everywhere)   Mild intermittent asthma    followed by pulmonology---- dr Judie Petit. [facility] Monks---  (03-11-2020 per pt last exacerbation early 2020);  CT chest 06-27-2019 in care everywhre   Pre-diabetes     Past Surgical History:  Procedure Laterality Date   ARTHRODESIS METATARSALPHALANGEAL JOINT (MTPJ) Right 12-15-2017  @ [facility]   DILATION AND CURETTAGE OF UTERUS  x3  last one 2004   HALLUX FUSION Left 03/12/2020   Procedure: HALLUX FUSION  METATARSALPHALANGEAL JOINT;  Surgeon: Vivi Barrack, DPM;  Location: [facility];  Service: Podiatry;  Laterality: Left;   HALLUX VALGUS LAPIDUS Left 03/12/2020   Procedure: HALLUX VALGUS LAPIDUS INCLUDING BUNIONECTOMY OR;  Surgeon: Vivi Barrack, DPM;  Location: [facility] ;  Service: Podiatry;  Laterality: Left;  CHOICE WITH LEG BLOCK   LAPAROSCOPIC BILATERAL SALPINGECTOMY Bilateral 09-26-2013 @ [facility] in [place]   tubal   ORIF ANKLE FRACTURE Right 2001   per pt hardware was removed    There were no vitals filed for this visit.    Subjective Assessment - 02/12/21 1651     Subjective Since last visit in September, pt has been doing well. Only problem currently is in the morning. She reports she's really stiff. Still struggling with stairs. Pt does report using a bone stimulator. CT performed and still saying 1st MTPJ athrodesis still not fully healed but plates and screws are still in good alignment. Getting 2nd opinion on CT. Pt is taking ibuprofen every now and then. At work she has accommodations to do ice, etc. At home she's still moving around/walking 3 days/wk. Has not peformed her prior exercises.    Limitations Lifting;Standing;Walking    How long can you stand comfortably? no issues    How long can you walk comfortably? ~20 min  and then has to stop and rest. 2,000-3,000 steps she can feel throbbing    Patient Stated Goals Improve stair negotiation    Currently in Pain? 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   [id number] (ICD-10-CM) - Left foot pain  M77.52 (ICD-10-CM) - Tendonitis of ankle, left    Referring Provider (PT) Vivi BarrackWagoner, Matthew R, DPM    Onset Date/Surgical Date 03/12/20    Hand Dominance Right    Prior Therapy For her L foot in Sept 2022      Precautions   Precautions None      Restrictions   Weight Bearing Restrictions No      Balance Screen   Has the patient fallen in the past 6 months No      Home Environment   Living Environment Private residence    Living Arrangements Spouse/significant other;Children    Available Help at Discharge Family    Type of Home House    Home Access Stairs to enter    Entrance Stairs-Number of Steps 2    Home Layout Two level    Alternate Level Stairs-Number of Steps 12    Alternate Level Stairs-Rails Right      Prior Function   Vocation Full time employment    [employer] -- walking and office work      Observation/Other Assessments   Focus on Therapeutic Outcomes (FOTO)  49 (risk adjusted 53); predicted 68      ROM / Strength   AROM / PROM / Strength AROM;PROM;Strength      AROM   AROM Assessment Site Ankle    Right/Left Ankle Right;Left    Right Ankle Dorsiflexion 5    Right Ankle Plantar Flexion 45    Right Ankle Inversion 35    Right Ankle Eversion 22    Left Ankle Dorsiflexion 5    Left Ankle Plantar Flexion 35    Left Ankle Inversion 30    Left Ankle Eversion 12      Strength   Strength Assessment Site Ankle;Knee;Hip    Right/Left Hip Right;Left    Right Hip Flexion 4+/5    Right Hip ABduction 4+/5    Left Hip Flexion 4+/5    Left Hip ABduction 4+/5    Right/Left Knee Right;Left    Right Knee Flexion 5/5    Right Knee Extension 5/5    Left Knee Flexion 5/5    Left Knee Extension 5/5    Right/Left Ankle Right;Left    Right Ankle Dorsiflexion 5/5    Right Ankle Plantar Flexion 5/5    Right Ankle Inversion 5/5    Right Ankle Eversion 5/5    Left Ankle Dorsiflexion 4-/5     Left Ankle Plantar Flexion 4/5    Left Ankle Inversion 4-/5    Left Ankle Eversion 4/5      Palpation   Palpation  comment TTP Posterior tibialis; plantar and dorsal surface of 1st MTP joint      Transfers   Five time sit to stand comments  15      High Level Balance   High Level Balance Comments Tandem stance: 14 sec with L LE back; SLS: 21 sec on L                        Objective measurements completed on examination: See above findings.                PT Education - 02/12/21 1734     Education Details Exam findings, POC, HEP    Person(s) Educated Patient    Methods Explanation;Demonstration;Tactile cues;Verbal cues;Handout    Comprehension Verbalized understanding;Returned demonstration;Verbal cues required;Tactile cues required                 PT Long Term Goals - 02/12/21 1740       PT LONG TERM GOAL #1   Title The patient will be indep with progression of HEP.    Time 6    Period Weeks    Status New    Target Date 03/26/21      PT LONG TERM GOAL #2   Title Pt will demo L = R ankle ROM    Time 6    Period Weeks    Status New    Target Date 03/26/21      PT LONG TERM GOAL #3   Title Pt will be able to ascend/descend 12 steps for home mobility with </=2/10 pain    Time 6    Period Weeks    Status New    Target Date 03/26/21      PT LONG TERM GOAL #4   Title Pt will be able to single leg stance >30 sec to demo improved L foot stability with </=2/10 pain    Time 6    Period Weeks    Status New    Target Date 03/26/21      PT LONG TERM GOAL #5   Title Pt will have improved FOTO score to at least 68    Baseline 49    Time 6    Period Weeks    Status New    Target Date 03/26/21                    Plan - 02/12/21 1735     Clinical Impression Statement Ms. Gidget Quizhpi is a 46 y/o F presenting to OPPT due to continued L foot pain/stiffness s/p L 1st MTPJ arthrodesis on 03/12/20. Pt has been doing well since  last PT episode in Sept; however, podiatry is suggesting another round of PT as it appears pt's foot is still not fully healed (per CT) and she is still having a few difficulties. On assessment, pt demonstrates decrease L ankle ROM and strength vs R with decreased stability/balance. Pt with TTP along L posterior tibialis. Pt would benefit from progression of her therapy to increase her overall strength for improved mobility with less pain.    Personal Factors and Comorbidities Time since onset of injury/illness/exacerbation;Age;Fitness    Examination-Activity Limitations Locomotion Level;Stairs    Examination-Participation Restrictions Community Activity;Cleaning;Occupation;Shop    Stability/Clinical Decision Making Evolving/Moderate complexity    Clinical Decision Making Moderate    Rehab Potential Good    PT Frequency 1x / week    PT Duration 6 weeks    PT  Treatment/Interventions ADLs/Self Care Home Management;Aquatic Therapy;Cryotherapy;Electrical Stimulation;Iontophoresis 4mg /ml Dexamethasone;Ultrasound;DME Instruction;Gait training;Stair training;Functional mobility training;Therapeutic activities;Therapeutic exercise;Balance training;Neuromuscular re-education;Manual techniques;Patient/family education;Orthotic Fit/Training;Dry needling;Taping    PT Next Visit Plan Assess response to HEP and modify/progress as needed. Manual therapy/TPDN as needed for post tib. Work on L foot/arch strengthening and stability. Work on Manufacturing systems engineerstair mechanics    PT Home Exercise Plan Access Code: [email or web address]    Consulted and Agree with Plan of Care Patient             Patient will benefit from skilled therapeutic intervention in order to improve the following deficits and impairments:  Decreased range of motion, Difficulty walking, Increased fascial restricitons, Pain, Decreased balance, Decreased mobility, Decreased strength  Visit Diagnosis: Pain in left foot  Other symptoms and signs involving the  musculoskeletal system  Difficulty in walking, not elsewhere classified  Muscle weakness (generalized)  Other abnormalities of gait and mobility     Problem List Patient Active Problem List   Diagnosis Date Noted   Fatty liver 09/09/2020   Esophageal dysmotility 09/20/2019   Regurgitation and rechewing 09/20/2019   Laryngospasms 08/24/2019   Chronic cough 08/01/2019   Snoring 08/01/2019   Allergic rhinitis due to animal (cat) (dog) hair and dander 04/19/2019   Allergic rhinitis due to pollen 04/19/2019   Chronic allergic conjunctivitis 04/19/2019   Gastro-esophageal reflux disease without esophagitis 04/19/2019   Arthritis 04/13/2019   Heart murmur 04/13/2019   Hypertrophy of uterus 09/28/2018   BMI 40.0-44.9, adult (HCC) 11/14/2017   Pes planus 10/18/2016   Prediabetes 09/09/2016   Encounter for routine gynecological examination 11/12/2014   Family history of breast cancer 11/12/2014   Obesity, Class II, BMI 35-39.9, with comorbidity 11/12/2014   Uncomplicated asthma 10/24/2014   History of female sterilization 08/05/2013   Hypertension 10/31/2012    [facility] April Dell PontoMa L Bijal Siglin, PT, DPT 02/12/2021, 5:44 PM  [facility] [address] [place], [place], [address] Phone: [phone]   Fax:  [phone]  Name: Brittney Grayerhereas A Fite MRN: [id number] Date of Birth: Jun 05, 1975

## 2021-02-23 ENCOUNTER — Encounter: Payer: Self-pay | Admitting: Podiatry

## 2021-02-23 ENCOUNTER — Other Ambulatory Visit: Payer: Self-pay | Admitting: Podiatry

## 2021-02-23 DIAGNOSIS — M79604 Pain in right leg: Secondary | ICD-10-CM

## 2021-02-26 ENCOUNTER — Ambulatory Visit: Admitting: Physical Therapy

## 2021-03-05 ENCOUNTER — Ambulatory Visit: Admitting: Physical Therapy

## 2021-03-12 ENCOUNTER — Other Ambulatory Visit: Payer: Self-pay

## 2021-03-12 ENCOUNTER — Encounter: Admitting: Physical Therapy

## 2021-03-12 ENCOUNTER — Ambulatory Visit

## 2021-03-12 DIAGNOSIS — M21619 Bunion of unspecified foot: Secondary | ICD-10-CM

## 2021-03-12 DIAGNOSIS — M216X1 Other acquired deformities of right foot: Secondary | ICD-10-CM

## 2021-03-12 DIAGNOSIS — M2060 Acquired deformities of toe(s), unspecified, unspecified foot: Secondary | ICD-10-CM

## 2021-03-12 NOTE — Progress Notes (Signed)
SITUATION: Reason for Visit: Fitting and Delivery of Custom Fabricated Foot Orthoses Patient Report: Patient reports comfort and is satisfied with device.  OBJECTIVE DATA: Patient History / Diagnosis:     ICD-10-CM   1. Prominent metatarsal head of right foot  M21.6X1     2. Acquired deformity of toe, unspecified laterality  M20.60     3. Bunion  M21.619       Provided Device:  Custom Functional Foot Orthotics     [employer]: [id number]  GOAL OF ORTHOSIS - Improve gait - Decrease energy expenditure - Improve Balance - Provide Triplanar stability of foot complex - Facilitate motion  ACTIONS PERFORMED Patient was fit with foot orthotics trimmed to shoe last. Patient tolerated fittign procedure.   Patient was provided with verbal and written instruction and demonstration regarding donning, doffing, wear, care, proper fit, function, purpose, cleaning, and use of the orthosis and in all related precautions and risks and benefits regarding the orthosis.  Patient was also provided with verbal instruction regarding how to report any failures or malfunctions of the orthosis and necessary follow up care. Patient was also instructed to contact our office regarding any change in status that may affect the function of the orthosis.  Patient demonstrated independence with proper donning, doffing, and fit and verbalized understanding of all instructions.  PLAN: Patient is to follow up in one week or as necessary (PRN). All questions were answered and concerns addressed. Plan of care was discussed with and agreed upon by the patient.

## 2021-03-23 ENCOUNTER — Encounter: Admitting: Physical Therapy

## 2021-03-27 ENCOUNTER — Ambulatory Visit (INDEPENDENT_AMBULATORY_CARE_PROVIDER_SITE_OTHER)

## 2021-03-27 ENCOUNTER — Ambulatory Visit (INDEPENDENT_AMBULATORY_CARE_PROVIDER_SITE_OTHER): Admitting: Podiatry

## 2021-03-27 ENCOUNTER — Other Ambulatory Visit: Payer: Self-pay

## 2021-03-27 DIAGNOSIS — G8929 Other chronic pain: Secondary | ICD-10-CM

## 2021-03-27 DIAGNOSIS — M21619 Bunion of unspecified foot: Secondary | ICD-10-CM

## 2021-03-27 DIAGNOSIS — M960 Pseudarthrosis after fusion or arthrodesis: Secondary | ICD-10-CM

## 2021-03-27 DIAGNOSIS — M25572 Pain in left ankle and joints of left foot: Secondary | ICD-10-CM

## 2021-03-30 ENCOUNTER — Ambulatory Visit: Attending: Podiatry | Admitting: Physical Therapy

## 2021-03-30 DIAGNOSIS — R2689 Other abnormalities of gait and mobility: Secondary | ICD-10-CM | POA: Insufficient documentation

## 2021-03-30 DIAGNOSIS — M7752 Other enthesopathy of left foot: Secondary | ICD-10-CM | POA: Insufficient documentation

## 2021-03-30 DIAGNOSIS — M6281 Muscle weakness (generalized): Secondary | ICD-10-CM | POA: Insufficient documentation

## 2021-03-30 DIAGNOSIS — R29898 Other symptoms and signs involving the musculoskeletal system: Secondary | ICD-10-CM | POA: Insufficient documentation

## 2021-03-30 DIAGNOSIS — R262 Difficulty in walking, not elsewhere classified: Secondary | ICD-10-CM | POA: Insufficient documentation

## 2021-03-30 DIAGNOSIS — M960 Pseudarthrosis after fusion or arthrodesis: Secondary | ICD-10-CM | POA: Insufficient documentation

## 2021-03-30 DIAGNOSIS — M79672 Pain in left foot: Secondary | ICD-10-CM | POA: Insufficient documentation

## 2021-04-03 NOTE — Progress Notes (Signed)
Subjective: Brittney Marshall is a 46 y.o. 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  She states that she is actually doing better compared to last appointment she is doing more normal activities wearing regular shoes with minimal swelling. She is wearing [place] which are doing great. The inserts are also helping.  She has only had to take ibuprofen 2 times since I last saw her.  She is still doing physical therapy which has been helpful.  No significant pain.  No recent injury or changes otherwise.  Objective: General: No acute distress, AAOx3  DP/PT pulses palpable 2/4, CRT < 3 sec to all digits.  Protective sensation intact. Motor function intact.  LEFT foot: Incision is well coapted without any evidence of dehiscence and scar is well formed.  There is no significant tenderness palpation.  Arthrodesis site is stable.  There is trace edema.  There is no erythema or warmth.  There is no area of pinpoint tenderness.   No pain with calf compression, swelling, warmth, erythema.   Assessment and Plan:  Status post left foot surgery, nonunion arthrodesis site; h/o DVT; right submetatarsal 1 callus  -Treatment options discussed including all alternatives, risks, and complications -X-rays obtained reviewed with hardware intact any complicating factors.  No evidence of acute fracture. -Overall condition she is continue to improve.  She is wearing regular shoes and doing normal activities with minimal swelling.  Continue physical therapy.  As she is not having significant discomfort and is not restricting her were going to hold off on any further surgery for now.  Were to keep on the same restrictions.  Return in about 3 months (around 06/24/2021).  [name]

## 2021-04-21 ENCOUNTER — Encounter: Payer: Self-pay | Admitting: Podiatry

## 2021-04-21 ENCOUNTER — Other Ambulatory Visit: Payer: Self-pay

## 2021-04-21 MED ORDER — IBUPROFEN 800 MG PO TABS: 800.0000 mg | ORAL_TABLET | Freq: Three times a day (TID) | ORAL | 0 refills | Status: DC | PRN

## 2021-06-26 ENCOUNTER — Ambulatory Visit (INDEPENDENT_AMBULATORY_CARE_PROVIDER_SITE_OTHER)

## 2021-06-26 ENCOUNTER — Ambulatory Visit (INDEPENDENT_AMBULATORY_CARE_PROVIDER_SITE_OTHER): Admitting: Podiatry

## 2021-06-26 DIAGNOSIS — M79672 Pain in left foot: Secondary | ICD-10-CM

## 2021-06-26 DIAGNOSIS — M778 Other enthesopathies, not elsewhere classified: Secondary | ICD-10-CM | POA: Diagnosis not present

## 2021-06-29 NOTE — Progress Notes (Signed)
Subjective: Brittney Marshall is a 46 y.o. 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  She states that she is doing better.  Today she is having significant pain.  She did try to increase her activity regards to walking but it did cause some discomfort as she had a scale back on that.  She is at times she still gets some soreness and swelling but not major not like it was.  She has had some swelling in given her history of DVT she saw vascular surgery yesterday.  No residual blood clot but there is no check what sounds to be a venous reflux study.  She stopped using the bone stimulator as it has stopped working.  She said that she did hit her toe on a hard traction about a month ago but that did get better.  She said that she does not want to proceed with any further surgery.  Objective: General: No acute distress, AAOx3 -wearing regular shoes. DP/PT pulses palpable 2/4, CRT < 3 sec to all digits.  Protective sensation intact. Motor function intact.  LEFT foot: Incision is well coapted without any evidence of dehiscence and scar is well formed.  There is no significant tenderness to palpation.  Arthrodesis site is stable.  There is trace edema but appears to be improving.  There is no erythema or warmth.  There is no area of pinpoint tenderness.   No pain with calf compression, swelling, warmth, erythema.   Assessment and Plan:  Status post left foot surgery, nonunion arthrodesis site; h/o DVT; right submetatarsal 1 callus  -Treatment options discussed including all alternatives, risks, and complications -X-rays obtained reviewed with hardware intact any complicating factors.  No evidence of acute fracture.  From my view it appears that there is some increased consolidation noted.  On the lateral view there is still some gapping plantarly.  I reviewed the x-rays with her. -Overall she is continue to improve.  We will continue supportive  shoe gear and gradually increase activity level as tolerated.  We will keep her on the same restrictions until I see her back in 3 months. -Follow-up with vascular surgery -She states that her vitamin D level was in the 40s at last check with her primary care doctor.  Return in about 3 months (around 09/26/2021).  [name]

## 2021-07-01 ENCOUNTER — Other Ambulatory Visit: Payer: Self-pay | Admitting: Podiatry

## 2021-07-01 DIAGNOSIS — M778 Other enthesopathies, not elsewhere classified: Secondary | ICD-10-CM

## 2021-07-03 IMAGING — US US EXTREM LOW VENOUS*L*
1 series · 13 of 24 positions shown · non-contrast
Comparison: 03/21/2020

CLINICAL DATA: Three day history of left lower extremity pain and
swelling. History of recent surgery.



[Series 1: us extrem low venous*left* · 0.08mm/px · 13 of 46 slices shown]
[im 1/46]
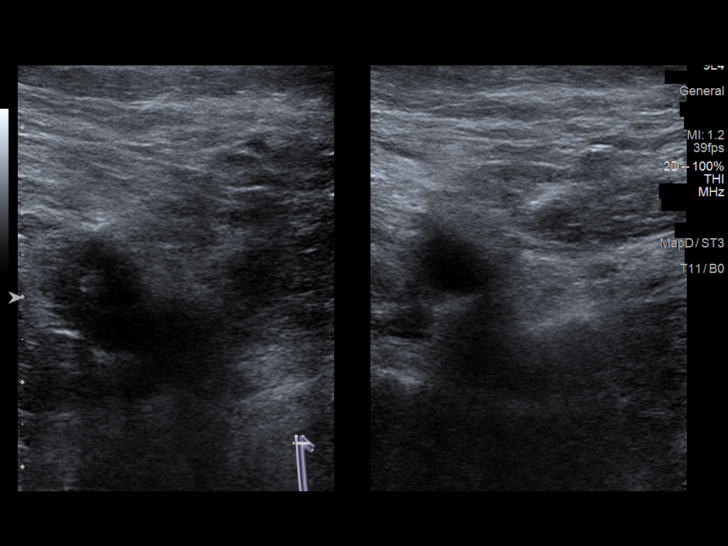
[im 4/46]
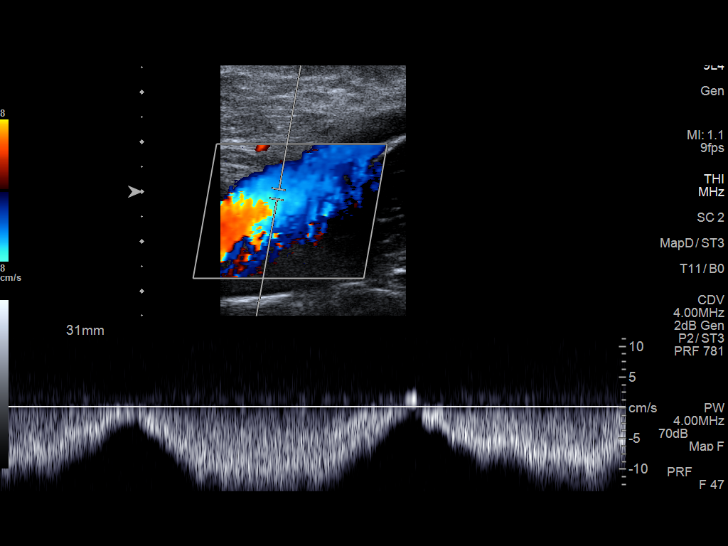
[im 8/46]
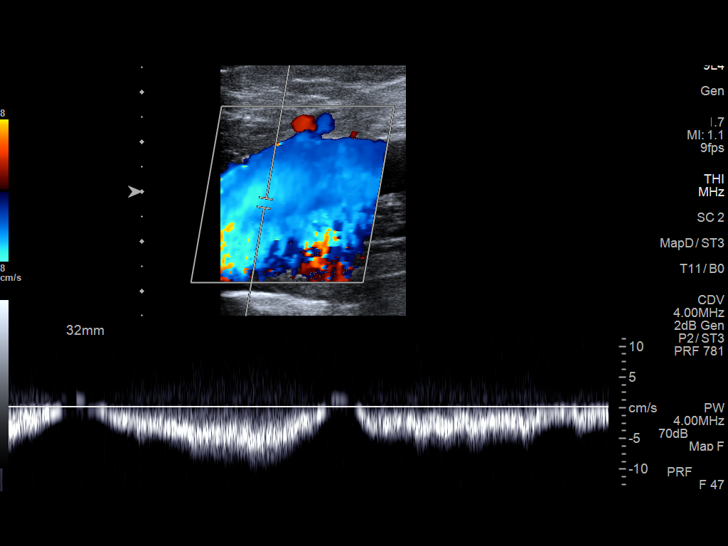
[im 12/46]
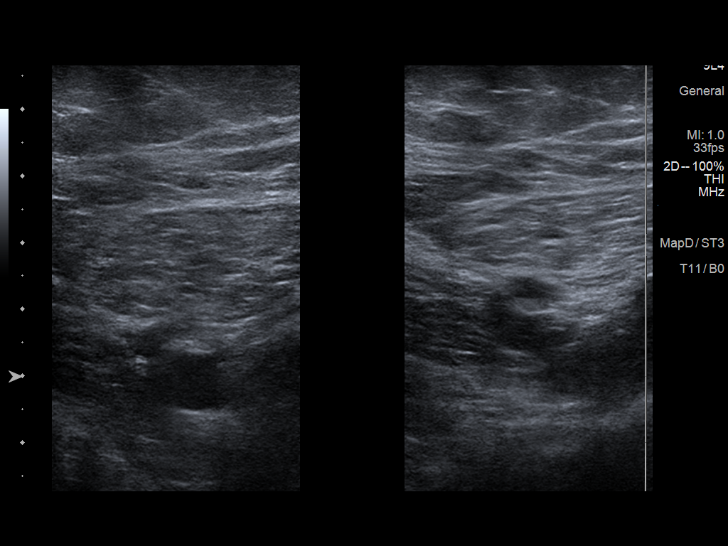
[im 16/46]
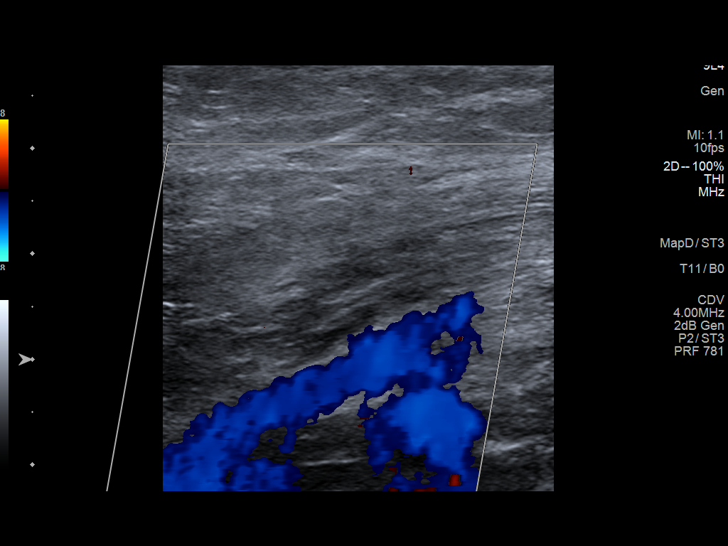
[im 20/46]
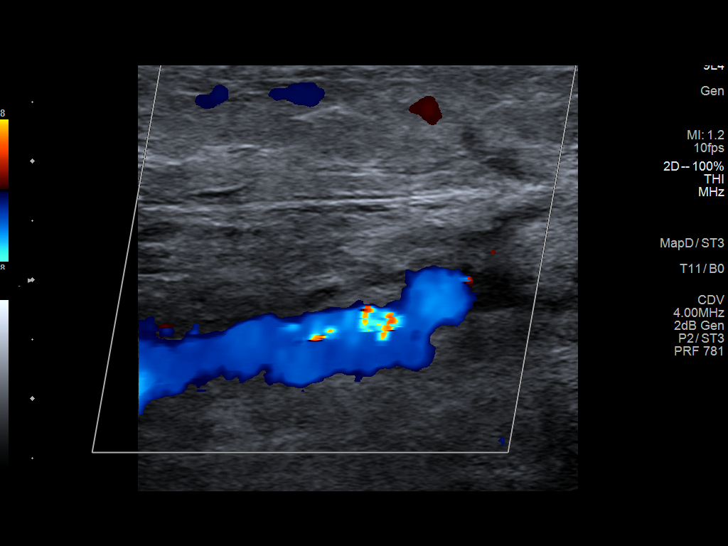
[im 24/46]
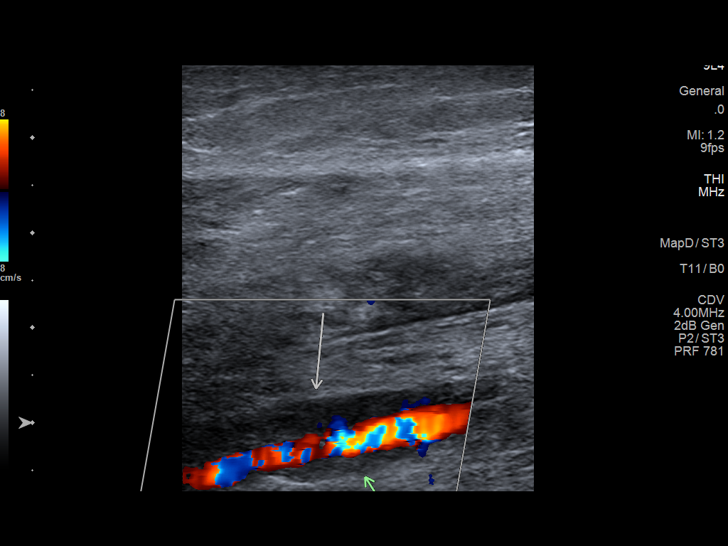
[im 26/46]
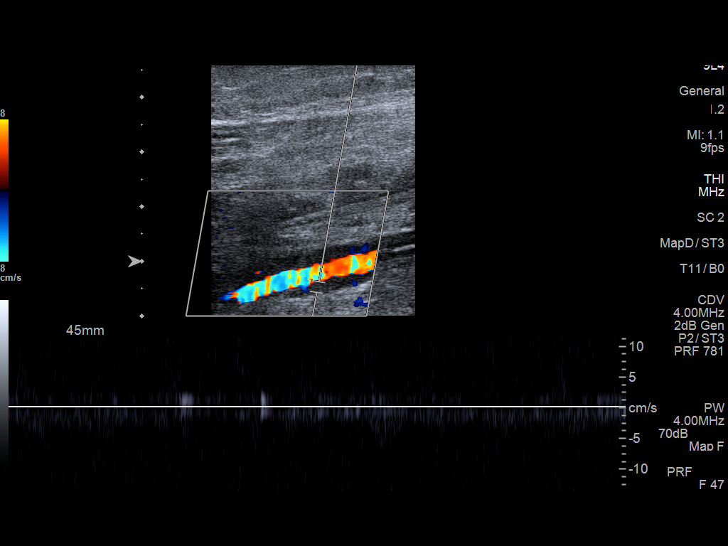
[im 30/46]
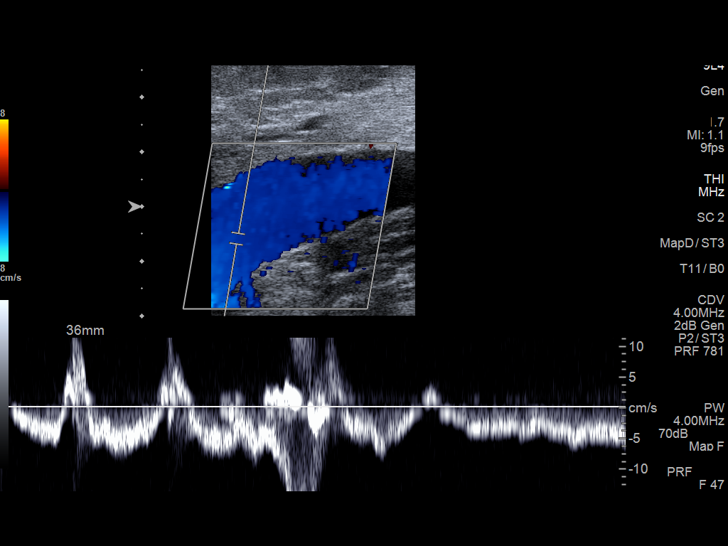
[im 34/46]
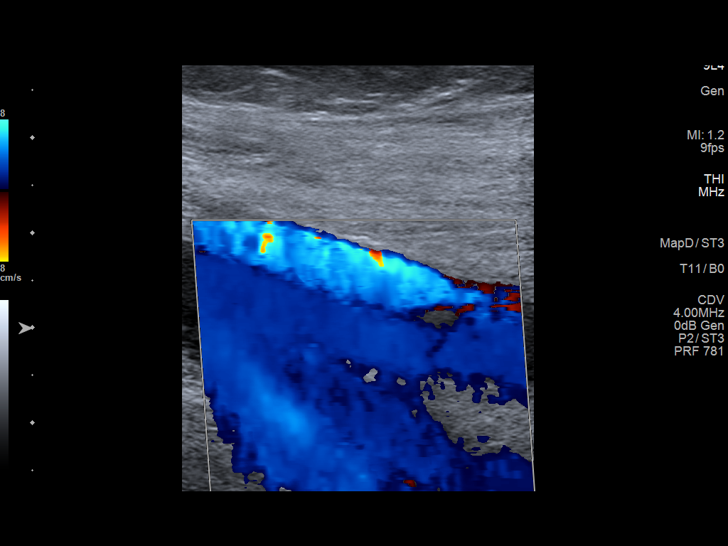
[im 38/46]
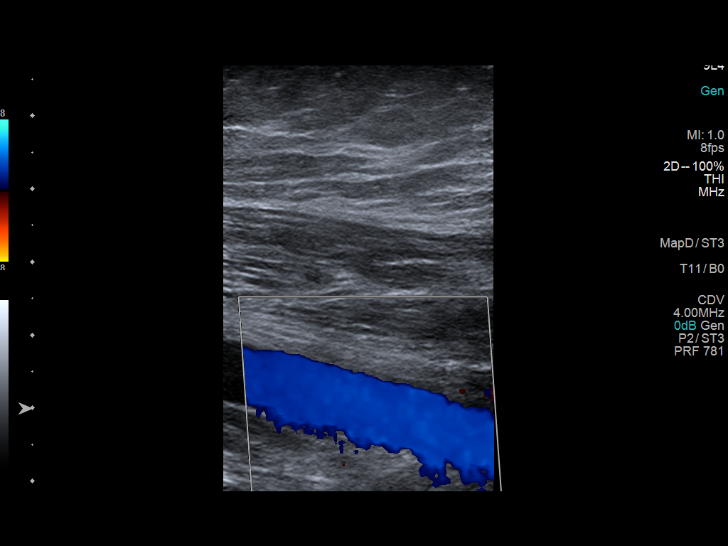
[im 42/46]
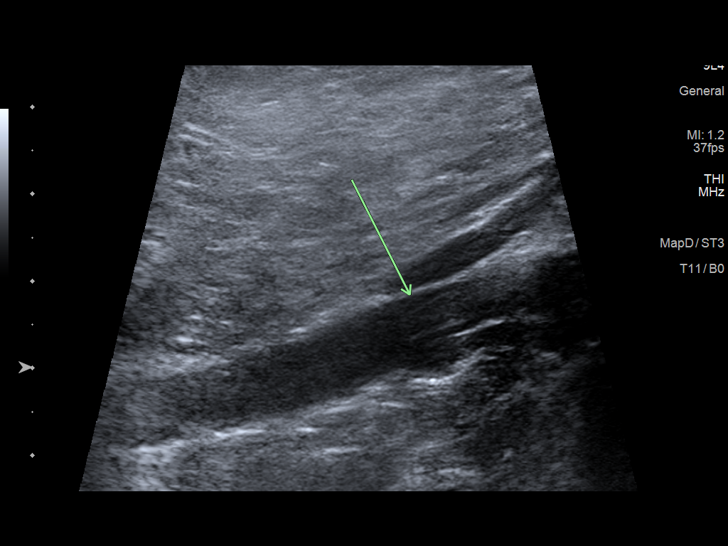
[im 46/46]
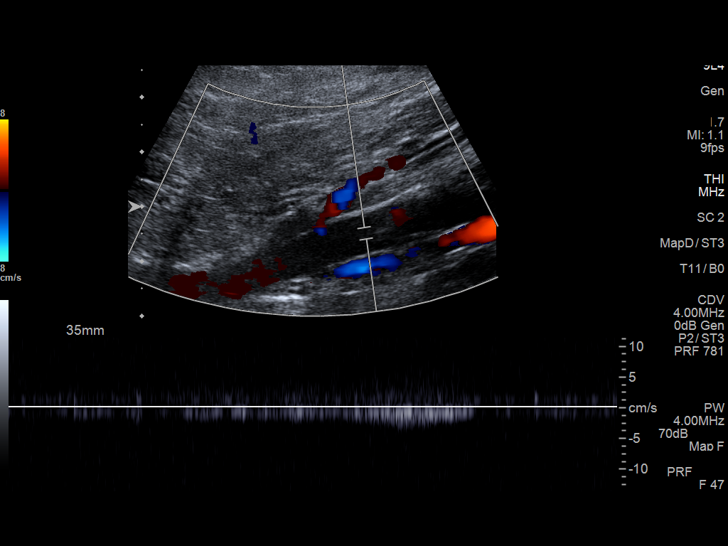

[13 of 24 positions shown; findings below may reference images not displayed]

FINDINGS: Contralateral Common Femoral Vein: Respiratory phasicity is normal
and symmetric with the symptomatic side. No evidence of thrombus.
Normal compressibility.

Common Femoral Vein: No evidence of thrombus. Normal
compressibility, respiratory phasicity and response to augmentation.

Saphenofemoral Junction: No evidence of thrombus. Normal
compressibility and flow on color Doppler imaging.

Profunda Femoral Vein: No evidence of thrombus. Normal
compressibility and flow on color Doppler imaging.

Femoral Vein: No evidence of thrombus. Normal compressibility,
respiratory phasicity and response to augmentation.

Popliteal Vein: Noncompressive occlusive thrombus.

Calf Veins: Non compressive occlusive thrombus in the popliteal
vein, posterior tibial vein and peroneal vein.

Superficial Great Saphenous Vein: No evidence of thrombus. Normal
compressibility.

Venous Reflux:  None.

Other Findings:  None.
IMPRESSION: Noncompressible occlusive thrombus in the left popliteal vein and
calf veins. The more proximal veins are patent.

These results will be called to the ordering clinician or
representative by the Radiologist Assistant, and communication
documented in the PACS or [REDACTED].

## 2021-07-04 IMAGING — DX DG FOOT COMPLETE 3+V*L*
3 series · 3 of 3 positions shown · non-contrast
Comparison: Left foot x-rays dated March 17, 2020.

CLINICAL DATA: Follow-up first MTP joint effusion.

EXAM:
LEFT FOOT - COMPLETE 3+ VIEW

[foot ap]
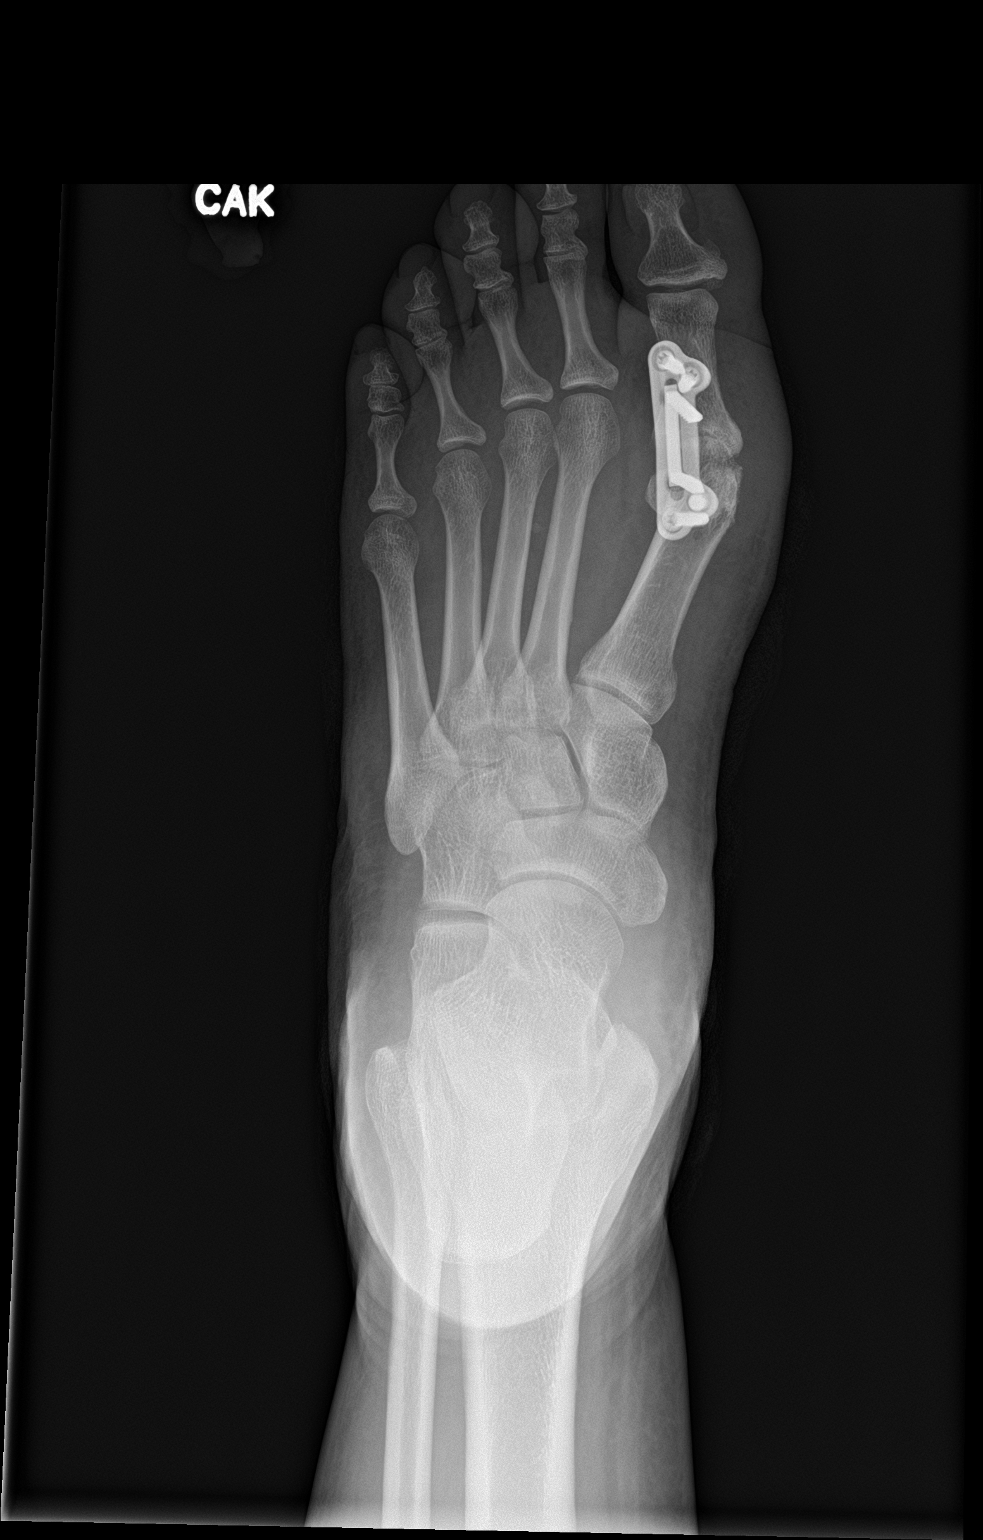

[foot obl]
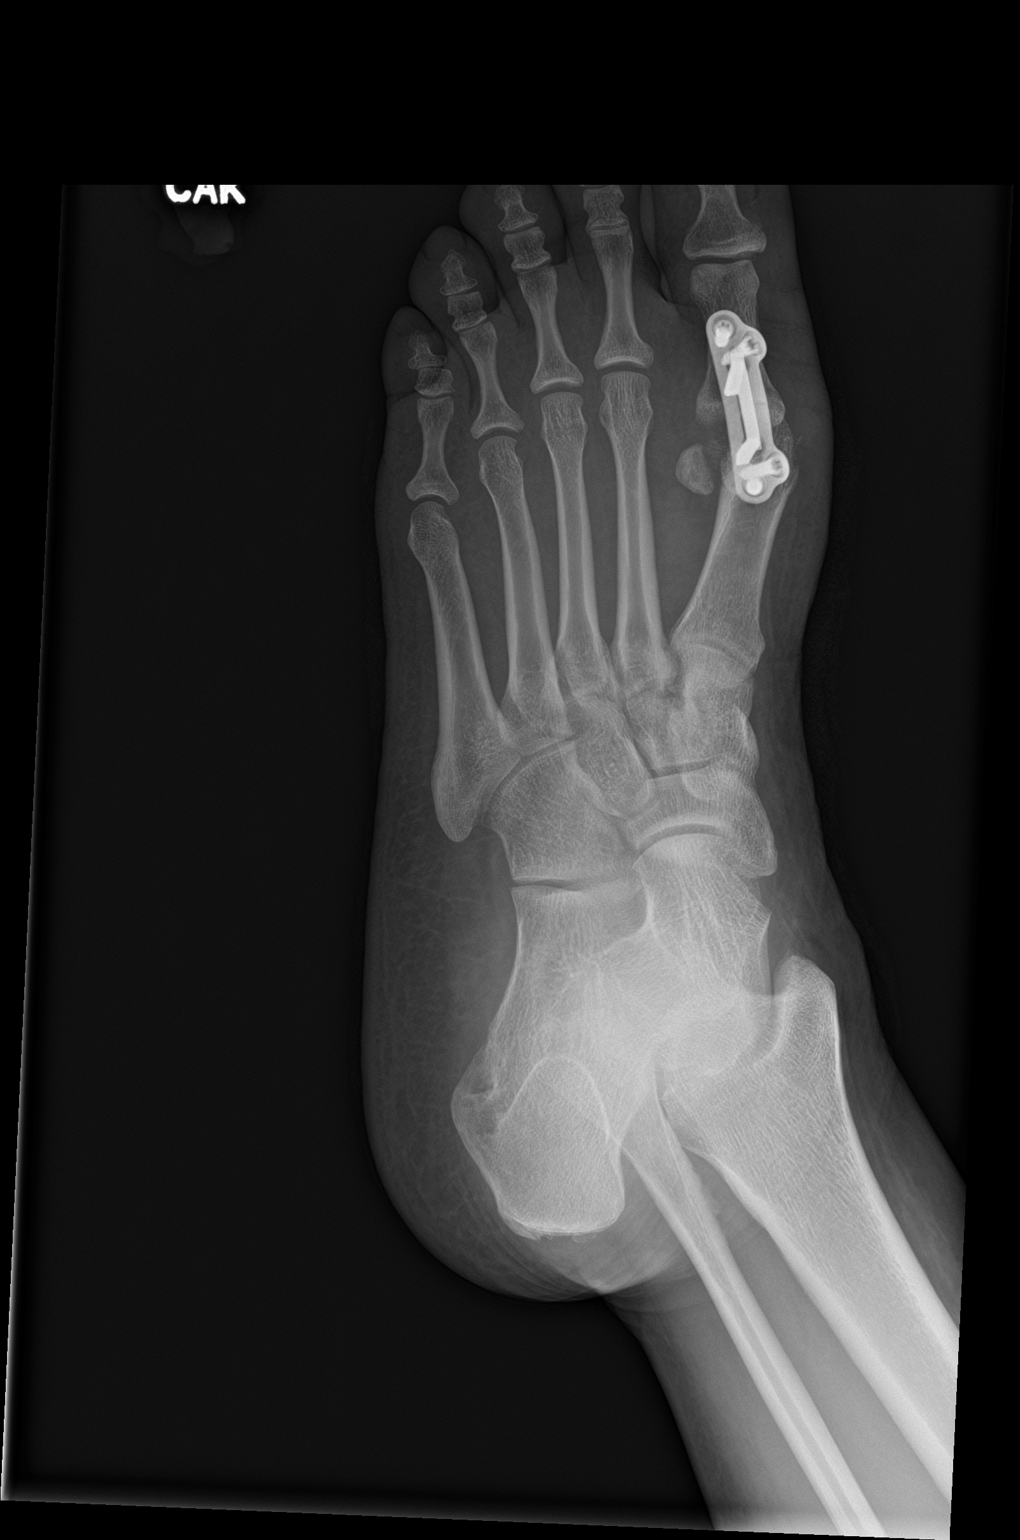

[foot lat]
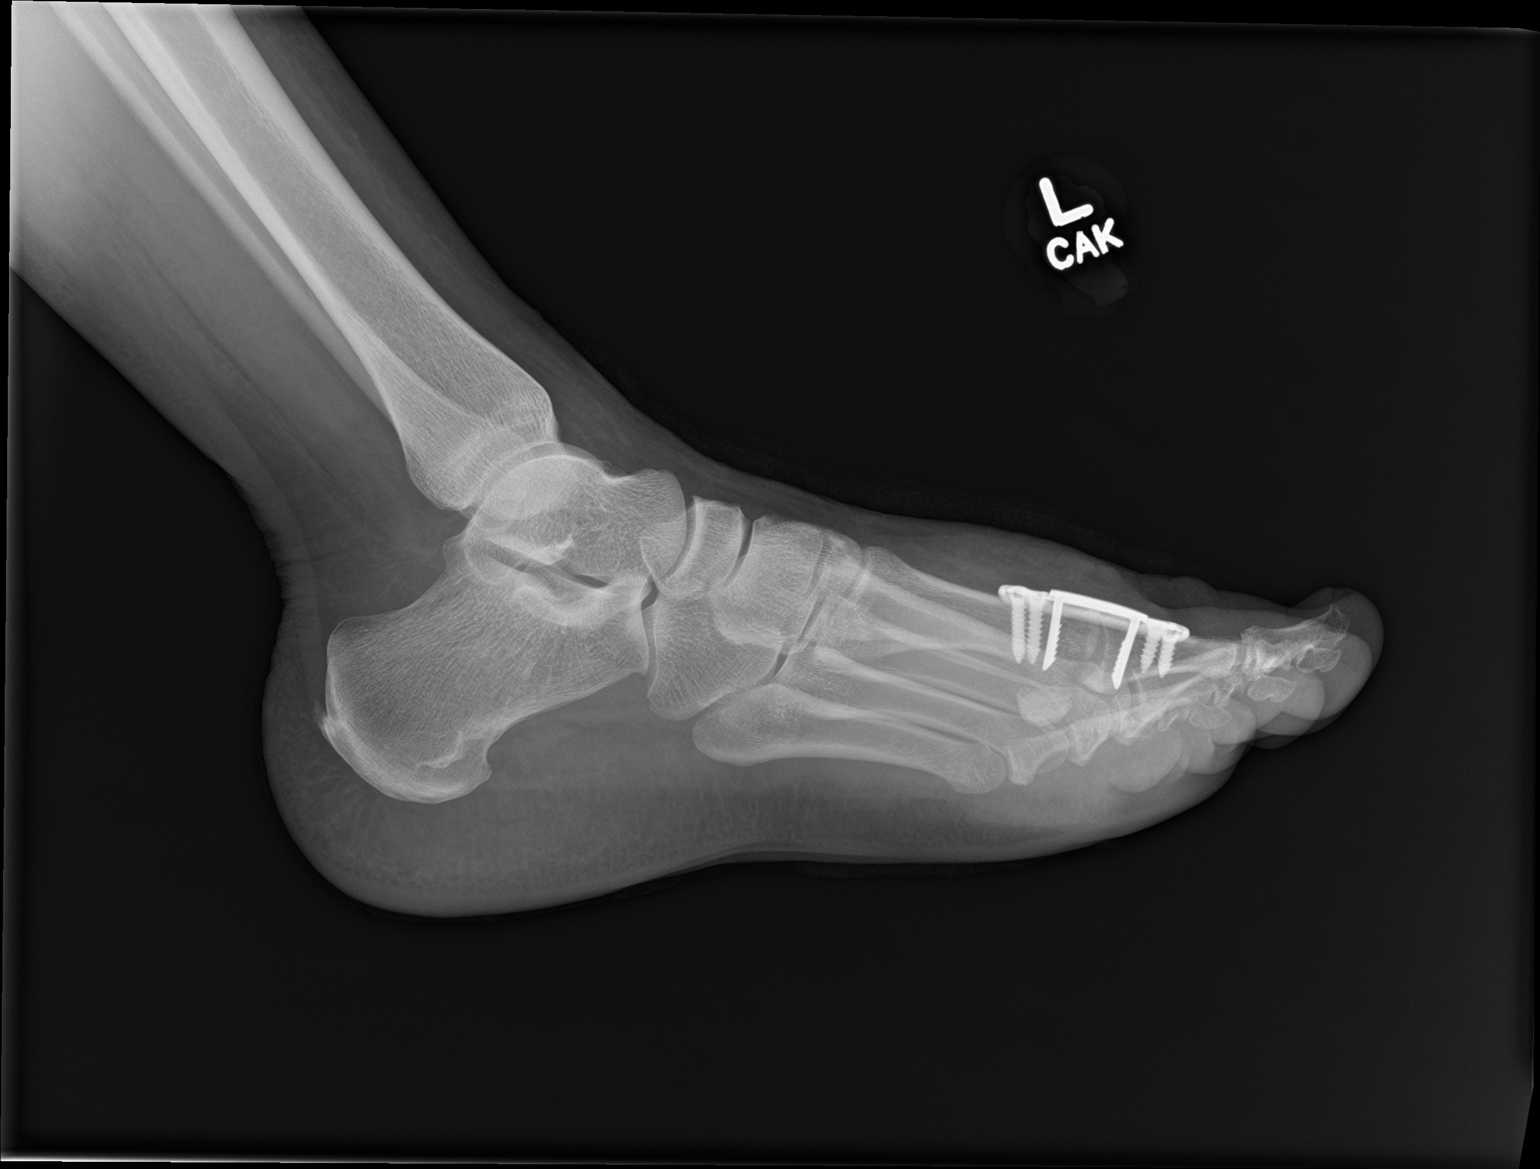

[3 of 3 positions shown; findings below may reference images not displayed]

FINDINGS: Postsurgical changes from first MTP joint arthrodesis again noted.
No osseous fusion yet. Remaining joint spaces are preserved. Bone
mineralization is normal. Mild soft tissue swelling around the first
MTP joint.
IMPRESSION: 1. Postsurgical changes from first MTP joint arthrodesis without
osseous fusion yet.

## 2021-08-11 ENCOUNTER — Other Ambulatory Visit: Payer: Self-pay | Admitting: Podiatry

## 2021-08-11 MED ORDER — IBUPROFEN 800 MG PO TABS: 800.0000 mg | ORAL_TABLET | Freq: Three times a day (TID) | ORAL | 0 refills | Status: DC | PRN

## 2021-09-25 ENCOUNTER — Ambulatory Visit (INDEPENDENT_AMBULATORY_CARE_PROVIDER_SITE_OTHER)

## 2021-09-25 ENCOUNTER — Ambulatory Visit (INDEPENDENT_AMBULATORY_CARE_PROVIDER_SITE_OTHER): Admitting: Podiatry

## 2021-09-25 DIAGNOSIS — M79672 Pain in left foot: Secondary | ICD-10-CM | POA: Diagnosis not present

## 2021-09-25 DIAGNOSIS — M960 Pseudarthrosis after fusion or arthrodesis: Secondary | ICD-10-CM | POA: Diagnosis not present

## 2021-09-25 MED ORDER — IBUPROFEN 800 MG PO TABS
800.0000 mg | ORAL_TABLET | Freq: Three times a day (TID) | ORAL | 0 refills | Status: DC | PRN
Start: 2021-09-25 — End: 2022-01-04

## 2021-09-25 NOTE — Progress Notes (Unsigned)
Subjective: Chief Complaint  Patient presents with   Foot Pain    3 MONTH LEFT FOOT PAIN, PAIN IS CONSTANT, VARIES IN INTENSITY    46 year old female presents with above concerns.  She states that she still gets swelling to her foot and she seen vascular surgery in she was told what she believes is venous insufficiency.  She states that she is is to ice and elevate her foot and she gets discomfort.  She is not able to proceed with any further surgery to her foot at this time.  Also her vitamin D level is back to being on the lower side so she has increased her vitamin D supplementation.  No recent injuries.  Objective: AAO x3, NAD DP/PT pulses palpable bilaterally, CRT less than 3 seconds Incisions well coapted with any signs of dehiscence.  There is no significant tenderness today on exam to the first MPJ.  There is some slight edema more to the ankle and the foot.  There is no erythema or warmth.  There is no other areas of tenderness.  She also describes some tenderness intermittently in the right ankle.  No area pinpoint tenderness. No pain with calf compression, swelling, warmth, erythema  Assessment: Nonunion left first MPJ  Plan: -All treatment options discussed with the patient including all alternatives, risks, complications.  -X-rays obtained and reviewed.  3 views left foot were obtained.  Hardware intact with any complicating factors.  There is some slight increased consolidation noted on the lateral view compared to prior x-rays. -We discussed again both conservative as well as surgical options.  She is still having discomfort I discussed with her revision reverse MPJ arthrodesis but she states that she is not able to do this at the time.  She is not financially able to do the surgery.  For now we will continue with the current restrictions at work, ice, elevation, compression, supportive shoes.  She is able to wear supportive sneakers however when she tries to wear flatter shoes she  is not able to do this for more than 30 minutes.  D consider revision of the surgery.  Iscussed continue supportive shoe gear for now.  Ultimately discussed with her that if she is continue to have symptoms -Patient encouraged to call the office with any questions, concerns, change in symptoms.   [name]

## 2021-10-22 ENCOUNTER — Encounter: Payer: Self-pay | Admitting: Podiatry

## 2021-10-26 ENCOUNTER — Encounter: Payer: Self-pay | Admitting: Podiatry

## 2021-10-26 ENCOUNTER — Telehealth: Payer: Self-pay | Admitting: Podiatry

## 2021-10-26 DIAGNOSIS — M960 Pseudarthrosis after fusion or arthrodesis: Secondary | ICD-10-CM

## 2021-10-26 NOTE — Telephone Encounter (Signed)
I called the patient to go over her concerns. She states she is still having occasional swelling to the toe. She has to wear tennis shoes but she has tried to wear other types of shoes but she is not able to. She is thinking about surgery to see if that will help. If she is interested in surgery I would recommend a new CT scan. I will order this for her. She would like her accommodations extended another year. I do not anticipate her needing to be out an entire year for 2024 and I told her once we are able will get her off of the restrictions we will.

## 2021-10-27 ENCOUNTER — Telehealth: Payer: Self-pay | Admitting: Podiatry

## 2021-10-27 ENCOUNTER — Encounter: Payer: Self-pay | Admitting: Podiatry

## 2021-10-27 NOTE — Telephone Encounter (Signed)
Please renew intermittent FMLA for patient, per Dr. Jacqualyn Posey. FMLA ends on 03/10/2022, holding for paperwork.

## 2021-12-18 ENCOUNTER — Ambulatory Visit: Admitting: Podiatry

## 2022-01-04 ENCOUNTER — Ambulatory Visit (INDEPENDENT_AMBULATORY_CARE_PROVIDER_SITE_OTHER): Admitting: Podiatry

## 2022-01-04 VITALS — BP 130/74 | HR 73 | Temp 97.3°F | Resp 17

## 2022-01-04 DIAGNOSIS — M79672 Pain in left foot: Secondary | ICD-10-CM

## 2022-01-04 DIAGNOSIS — M960 Pseudarthrosis after fusion or arthrodesis: Secondary | ICD-10-CM

## 2022-01-04 MED ORDER — IBUPROFEN 800 MG PO TABS
800.0000 mg | ORAL_TABLET | Freq: Three times a day (TID) | ORAL | 0 refills | Status: DC | PRN
Start: 2022-01-04 — End: 2022-03-09

## 2022-01-04 NOTE — Progress Notes (Unsigned)
Subjective: Chief Complaint  Patient presents with   Foot Pain    Left foot pain, patient states that she is feeling less pain, hallux , sore throbbing, rate of pain 3 out of 41,     46 year old female presents the office with above concerns.  She states that she was doing "real good at one point" as long as she is on her restructions at work. For the last 1.5 months they moved her and went to the "front line". She was being moved around more.  She states has been walking having to push objects which is putting more stress on her feet which aggravate her symptoms.  She apparently was told that she cannot go back on restrictions due to lack of employees until March.  She said that she been doing well with ibuprofen but asking for refill.  She does not have much swelling.   Objective: AAO x3, NAD DP/PT pulses palpable bilaterally, CRT less than 3 seconds Status post first MTP arthrodesis with arthrodesis second clinical appears to be stable.  There is trace edema there is no erythema or warmth.  She has mild discomfort localized on the first MPJ but no other area discomfort on the left side.  Flexor, extensor tendons appear to be intact.  MMT 5/5. No pain with calf compression, swelling, warmth, erythema  Assessment: Nonunion left first MPJ-chronic pain  Plan: -All treatment options discussed with the patient including all alternatives, risks, complications.  -She was doing quite well for some time while she was on restrictions but unfortunately her work does not allow her to remain on restrictions.  Due to this she is asking about being out of work.  I will be out of work for 1 month to allow this to calm down but she needs to go back with restrictions.  I wanted the same restrictions that she was on previously.  Continue shoes and good arch support.  Continue ice, elevate as well as compression to help with the edema although minimal today.  Ibuprofen as needed. -She is continue to have pain  again discussed CT scan, further surgical intervention, revision.  [name]

## 2022-01-06 ENCOUNTER — Telehealth: Payer: Self-pay | Admitting: Podiatry

## 2022-01-06 ENCOUNTER — Encounter: Payer: Self-pay | Admitting: Podiatry

## 2022-01-06 NOTE — Telephone Encounter (Signed)
Brittney Marshall is scheduled to see you on 03/09/2022, she wanted to remain out of work until she comes in to see you. Please advise?

## 2022-01-07 ENCOUNTER — Encounter: Payer: Self-pay | Admitting: Podiatry

## 2022-03-09 ENCOUNTER — Ambulatory Visit (INDEPENDENT_AMBULATORY_CARE_PROVIDER_SITE_OTHER): Admitting: Podiatry

## 2022-03-09 DIAGNOSIS — L84 Corns and callosities: Secondary | ICD-10-CM

## 2022-03-09 DIAGNOSIS — M960 Pseudarthrosis after fusion or arthrodesis: Secondary | ICD-10-CM

## 2022-03-09 MED ORDER — IBUPROFEN 800 MG PO TABS
800.0000 mg | ORAL_TABLET | Freq: Three times a day (TID) | ORAL | 0 refills | Status: DC | PRN
Start: 2022-03-09 — End: 2022-07-16

## 2022-03-09 NOTE — Progress Notes (Signed)
Subjective: Chief Complaint  Patient presents with   Foot Pain    Left foot, no pain today, pain normally occurs midday, around the left great toe     48 year old female presents the office with above concerns.  She states she has been doing "pretty well". Overall, doing "pretty good so far". Since she has not been working it has improved. Not swelling.   The right foot actually causes more problems.  This demonstrates a callus submetatarsal 1.   Objective: AAO x3, NAD DP/PT pulses palpable bilaterally, CRT less than 3 seconds Status post first MTP arthrodesis with arthrodesis second clinical appears to be stable.  There is no significant edema or warmth.  There is no tenderness palpation on exam.  Hyperkeratotic lesion right foot submetatarsal 1 and this causes most of her symptoms.  There is no underlying ulceration drainage or signs of infection. No open lesions. No pain with calf compression, swelling, warmth, erythema  Assessment: Nonunion left first MPJ-chronic pain; callus submetatarsal 1 right foot  Plan: -All treatment options discussed with the patient including all alternatives, risks, complications.  -Sharply debrided the callus on the right foot with any complications or bleeding. -Overall she is improving on the left side.  Will release her back to work with the same restrictions.  She will about one half daily for 2 weeks and then full-time with restrictions. -Continue supportive shoe gear.  Anti-inflammatories as needed -Bring in inserts to offload right 1st MTPJ and left 5th.   Trula Slade DPM

## 2022-03-16 ENCOUNTER — Encounter: Payer: Self-pay | Admitting: Podiatry

## 2022-04-06 ENCOUNTER — Other Ambulatory Visit

## 2022-05-14 ENCOUNTER — Other Ambulatory Visit

## 2022-06-02 ENCOUNTER — Other Ambulatory Visit

## 2022-06-08 ENCOUNTER — Ambulatory Visit: Admitting: Podiatry

## 2022-06-25 ENCOUNTER — Ambulatory Visit: Admitting: Podiatry

## 2022-07-16 ENCOUNTER — Ambulatory Visit (INDEPENDENT_AMBULATORY_CARE_PROVIDER_SITE_OTHER)

## 2022-07-16 ENCOUNTER — Ambulatory Visit (INDEPENDENT_AMBULATORY_CARE_PROVIDER_SITE_OTHER): Admitting: Podiatry

## 2022-07-16 DIAGNOSIS — M960 Pseudarthrosis after fusion or arthrodesis: Secondary | ICD-10-CM

## 2022-07-16 DIAGNOSIS — M79672 Pain in left foot: Secondary | ICD-10-CM | POA: Diagnosis not present

## 2022-07-16 MED ORDER — IBUPROFEN 800 MG PO TABS: 800.0000 mg | ORAL_TABLET | Freq: Three times a day (TID) | ORAL | 0 refills | Status: DC | PRN

## 2022-07-16 NOTE — Progress Notes (Unsigned)
Subjective: Chief Complaint  Patient presents with   Routine Post Op    Pt states she feels good and her work is still making accomodation     Overall doing well, some intermittent swelling. She is still on accomodation which helps.  No recent injury or changes otherwise.  Concerns.   Objective: AAO x3, NAD- wearing sneakers DP/PT pulses palpable bilaterally, CRT less than 3 seconds Status post first MTP arthrodesis with arthrodesis site appears stable at this time.  There is no edema or warmth.  There is no tenderness palpation on exam.  No pain on exam today. No pain with calf compression, swelling, warmth, erythema  Assessment: Nonunion left first MPJ-chronic pain  Plan: -All treatment options discussed with the patient including all alternatives, risks, complications.  -X-rays obtained reviewed of the left foot today.  Hardware intact with any complicating factors.  On the lateral view there is some sclerosis noted along the first MTPJ. -At this time she is not in any pain but she does state she gets this after being on her feet a lot.  Will also swell at times.  She is still taking ibuprofen and on restrictions at work that help.  We discussed that towards the end of the year with restrictions right now if she is continue to have symptoms we need to reevaluate consider surgical intervention, revision of the first MPJ arthrodesis. Otherwise, once the restrictions run out, I would like to try to get back to regular duty to see how she does.  [name]

## 2022-08-22 ENCOUNTER — Encounter: Payer: Self-pay | Admitting: Podiatry

## 2022-08-24 ENCOUNTER — Encounter: Payer: Self-pay | Admitting: Podiatry

## 2022-08-24 NOTE — Telephone Encounter (Signed)
Can you please assist? Thanks  

## 2022-09-10 ENCOUNTER — Ambulatory Visit: Admitting: Podiatry

## 2022-10-02 ENCOUNTER — Other Ambulatory Visit: Payer: Self-pay | Admitting: Podiatry

## 2022-10-02 MED ORDER — IBUPROFEN 800 MG PO TABS: 800.0000 mg | ORAL_TABLET | Freq: Three times a day (TID) | ORAL | 0 refills | Status: AC | PRN

## 2022-12-03 ENCOUNTER — Ambulatory Visit: Admitting: Podiatry

## 2022-12-06 ENCOUNTER — Ambulatory Visit: Admitting: Podiatry

## 2023-02-07 ENCOUNTER — Ambulatory Visit: Admitting: Podiatry

## 2023-02-14 ENCOUNTER — Ambulatory Visit: Admitting: Podiatry
